# Patient Record
Sex: Male | Born: 1970 | Race: White | Hispanic: No | Marital: Single | Smoking: Never smoker
Health system: Southern US, Community
[De-identification: ages and names within clinical notes are randomized; demographics above are authoritative.]

## PROBLEM LIST (undated history)

## (undated) DIAGNOSIS — I10 Essential (primary) hypertension: Secondary | ICD-10-CM

## (undated) HISTORY — DX: Essential (primary) hypertension: I10

---

## 2005-06-03 ENCOUNTER — Emergency Department (HOSPITAL_COMMUNITY): Admission: EM | Admit: 2005-06-03 | Discharge: 2005-06-03 | Payer: Self-pay | Admitting: Family Medicine

## 2017-06-07 ENCOUNTER — Emergency Department: Payer: Self-pay

## 2017-06-07 ENCOUNTER — Encounter: Payer: Self-pay | Admitting: Emergency Medicine

## 2017-06-07 ENCOUNTER — Emergency Department
Admission: EM | Admit: 2017-06-07 | Discharge: 2017-06-07 | Disposition: A | Discharge status: Home / Self Care | Payer: Self-pay | Attending: Emergency Medicine | Admitting: Emergency Medicine

## 2017-06-07 ENCOUNTER — Other Ambulatory Visit: Payer: Self-pay

## 2017-06-07 DIAGNOSIS — R079 Chest pain, unspecified: Secondary | ICD-10-CM | POA: Insufficient documentation

## 2017-06-07 DIAGNOSIS — K2970 Gastritis, unspecified, without bleeding: Secondary | ICD-10-CM | POA: Insufficient documentation

## 2017-06-07 LAB — BASIC METABOLIC PANEL
Anion gap: 13 (ref 5–15)
BUN: 9 mg/dL (ref 6–20)
CHLORIDE: 104 mmol/L (ref 101–111)
CO2: 22 mmol/L (ref 22–32)
CREATININE: 1.32 mg/dL — AB (ref 0.61–1.24)
Calcium: 10 mg/dL (ref 8.9–10.3)
GFR calc Af Amer: >60 mL/min (ref >60)
GFR calc non Af Amer: >60 mL/min (ref >60)
GLUCOSE: 96 mg/dL (ref 65–99)
POTASSIUM: 3.7 mmol/L (ref 3.5–5.1)
SODIUM: 139 mmol/L (ref 135–145)

## 2017-06-07 LAB — CBC
HEMATOCRIT: 42.8 % (ref 40.0–52.0)
Hemoglobin: 14.9 g/dL (ref 13.0–18.0)
MCH: 31.9 pg (ref 26.0–34.0)
MCHC: 34.7 g/dL (ref 32.0–36.0)
MCV: 92 fL (ref 80.0–100.0)
PLATELETS: 323 10*3/uL (ref 150–440)
RBC: 4.66 MIL/uL (ref 4.40–5.90)
RDW: 12.7 % (ref 11.5–14.5)
WBC: 9.9 10*3/uL (ref 3.8–10.6)

## 2017-06-07 LAB — TROPONIN I: Troponin I: <0.03 ng/mL (ref <0.03)

## 2017-06-07 MED ORDER — FAMOTIDINE 40 MG PO TABS: 40.0000 mg | ORAL_TABLET | Freq: Every evening | ORAL | 1 refills | Status: DC

## 2017-06-07 MED ORDER — GI COCKTAIL ~~LOC~~
ORAL | Status: AC
  Administered 2017-06-07: 30 mL via ORAL
  Filled 2017-06-07: qty 30

## 2017-06-07 MED ORDER — SUCRALFATE 1 G PO TABS: 1.0000 g | ORAL_TABLET | Freq: Four times a day (QID) | ORAL | 0 refills | Status: DC

## 2017-06-07 MED ORDER — GI COCKTAIL ~~LOC~~
30.0000 mL | Freq: Once | ORAL | Status: AC
  Administered 2017-06-07: 30 mL via ORAL

## 2017-06-07 NOTE — Discharge Instructions (Signed)
Please seek medical attention for any high fevers, chest pain, shortness of breath, change in behavior, persistent vomiting, bloody stool or any other new or concerning symptoms.  

## 2017-06-07 NOTE — ED Notes (Signed)
On assessment pt with c/o of upper abd/lower chest pain. Some burning of the chest and difficulty swallowing x 3-4 days. States he has been sick for about a month and was taking mucinex and alka seltzer cold medicine

## 2017-06-07 NOTE — ED Provider Notes (Signed)
[facility] Emergency Department Provider Note   ____________________________________________   I have reviewed the triage vital signs and the nursing notes.   HISTORY  Chief Complaint Chest Pain   History limited by: Not Limited   HPI Jaime Moore is a 47 y.o. male who presents to the emergency department today because of concerns for chest pain.  Patient states this is been going on for the past 3 weeks.  He describes it as tightness.  It is located in the center chest.  He states it is worse at night when he lies down and then goes away throughout the day.  He has not had any associated shortness of breath.  No nausea or vomiting.  He denies similar symptoms in the past.   History reviewed. No pertinent past medical history.  There are no active problems to display for this patient.   History reviewed. No pertinent surgical history.  Prior to Admission medications   Not on File    Allergies Patient has no known allergies.  No family history on file.  Social History Social History   Tobacco Use  . Smoking status: Never Smoker  . Smokeless tobacco: Never Used  Substance Use Topics  . Alcohol use: No    Frequency: Never  . Drug use: No    Review of Systems Constitutional: No fever/chills Eyes: No visual changes. ENT: Difficulty with swallowing.  Cardiovascular: Positive for chest pain. Respiratory: Denies shortness of breath. Gastrointestinal: No abdominal pain.  No nausea, no vomiting.  No diarrhea.   Genitourinary: Negative for dysuria. Musculoskeletal: Negative for back pain. Skin: Negative for rash. Neurological: Negative for headaches, focal weakness or numbness.  ____________________________________________   PHYSICAL EXAM:  VITAL SIGNS: ED Triage Vitals [06/07/17 1646]  Enc Vitals Group     BP      Pulse Rate 82     Resp 18     Temp 98.1 F (36.7 C)     Temp Source Oral     SpO2 96 %     Weight      Height    Head Circumference      Peak Flow      Pain Score 8   Constitutional: Alert and oriented. Well appearing and in no distress. Eyes: Conjunctivae are normal.  ENT   Head: Normocephalic and atraumatic.   Nose: No congestion/rhinnorhea.   Mouth/Throat: Mucous membranes are moist.   Neck: No stridor. Hematological/Lymphatic/Immunilogical: No cervical lymphadenopathy. Cardiovascular: Normal rate, regular rhythm.  No murmurs, rubs, or gallops.  Respiratory: Normal respiratory effort without tachypnea nor retractions. Breath sounds are clear and equal bilaterally. No wheezes/rales/rhonchi. Gastrointestinal: Soft and non tender. No rebound. No guarding.  Genitourinary: Deferred Musculoskeletal: Normal range of motion in all extremities. No lower extremity edema. Neurologic:  Normal speech and language. No gross focal neurologic deficits are appreciated.  Skin:  Skin is warm, dry and intact. No rash noted. Psychiatric: Mood and affect are normal. Speech and behavior are normal. Patient exhibits appropriate insight and judgment.  ____________________________________________    LABS (pertinent positives/negatives)  Trop <0.03 CBC wnl BMP cr 1.32  ____________________________________________   EKG  I, Phineas SemenGraydon Daion Ginsberg, attending physician, personally viewed and interpreted this EKG  EKG Time: 1637 Rate: 94 Rhythm: normal sinus rhythm Axis: normal Intervals: qtc 437 QRS: narrow ST changes: no st elevation Impression: possible left atrial enlargement, borderline EKG   ____________________________________________    RADIOLOGY  CXR No acute disease  ____________________________________________   PROCEDURES  Procedures  ____________________________________________   INITIAL IMPRESSION / ASSESSMENT AND PLAN / ED COURSE  Pertinent labs & imaging results that were available during my care of the patient were reviewed by me and considered in my medical decision  making (see chart for details).  Patient presented to the emergency department today because of concerns for difficulty with swallowing.  Differential would be broad including cardiac etiology, pneumonia, pneumothorax, esophagitis, anxiety, musculoskeletal inflammation amongst other etiologies.  Patient's chest x-ray and blood work without concerning findings.  EKG without any concerning findings.  At this time I do wonder if it could be related more to esophagitis and GERD.  It is worse when he lies down at bed at night. Discussed findings and plan with patient. Will discharge with antacid and sucralfate prescription.  ____________________________________________   FINAL CLINICAL IMPRESSION(S) / ED DIAGNOSES  Final diagnoses:  Nonspecific chest pain  Gastritis without bleeding, unspecified chronicity, unspecified gastritis type     Note: This dictation was prepared with Dragon dictation. Any transcriptional errors that result from this process are unintentional     Phineas Semen, MD 06/08/17 1555

## 2017-06-07 NOTE — ED Triage Notes (Signed)
Pt reports chest tightness and difficulty swallowing x 3 weeks, states he is tired of dealing with it and family encouraged him come to be evaluated.  Pt has hx of brother in 7550s die from MI.

## 2018-09-17 ENCOUNTER — Emergency Department: Payer: Self-pay

## 2018-09-17 ENCOUNTER — Emergency Department
Admission: EM | Admit: 2018-09-17 | Discharge: 2018-09-17 | Disposition: A | Discharge status: Home / Self Care | Payer: Self-pay | Attending: Emergency Medicine | Admitting: Emergency Medicine

## 2018-09-17 ENCOUNTER — Encounter: Payer: Self-pay | Admitting: Emergency Medicine

## 2018-09-17 ENCOUNTER — Other Ambulatory Visit: Payer: Self-pay

## 2018-09-17 DIAGNOSIS — M436 Torticollis: Secondary | ICD-10-CM | POA: Insufficient documentation

## 2018-09-17 MED ORDER — CYCLOBENZAPRINE HCL 10 MG PO TABS: 10.0000 mg | ORAL_TABLET | Freq: Three times a day (TID) | ORAL | 0 refills | Status: DC | PRN

## 2018-09-17 MED ORDER — MELOXICAM 15 MG PO TABS: 15.0000 mg | ORAL_TABLET | Freq: Every day | ORAL | 0 refills | Status: DC

## 2018-09-17 NOTE — ED Notes (Signed)
See triage note  Presents with pain to left side of neck and into left shoulder  States pain started about 2 weeks ago   States he woke up with pain  Min relief  With ice/heat and IBU

## 2018-09-17 NOTE — Discharge Instructions (Signed)
Follow up with orthopedics if you are not improving over the week.  Return to the ER for symptoms that change or worsen if unable to schedule an appointment with the specialist or your primary care provider.

## 2018-09-17 NOTE — ED Triage Notes (Signed)
Presents with pain from neck into left shoulder   States pain started 2 weeks ago w/o injury

## 2018-09-17 NOTE — ED Provider Notes (Signed)
[facility] Emergency Department Provider Note ____________________________________________  Time seen: Approximately 10:20 AM  I have reviewed the triage vital signs and the nursing notes.   HISTORY  Chief Complaint Neck Pain and Shoulder Pain    HPI Jaime Moore is a 48 y.o. male who presents to the emergency department for evaluation and treatment of left side neck pain x 2 weeks. Pain started upon awakening. No relief with ibuprofen, tylenol, and heating pad. No specific injury, but he does a lot of heavy lifting. No history of neck pain, but has had bulging disc in his back.   History reviewed. No pertinent past medical history.  There are no active problems to display for this patient.   History reviewed. No pertinent surgical history.  Prior to Admission medications   Medication Sig Start Date End Date Taking? Authorizing Provider  cyclobenzaprine (FLEXERIL) 10 MG tablet Take 1 tablet (10 mg total) by mouth 3 (three) times daily as needed. 09/17/18   Nehemiah Mcfarren, Rulon Eisenmengerari B, FNP  meloxicam (MOBIC) 15 MG tablet Take 1 tablet (15 mg total) by mouth daily. 09/17/18   Chinita Pesterriplett, Bardia Wangerin B, FNP    Allergies Patient has no known allergies.  No family history on file.  Social History Social History   Tobacco Use  . Smoking status: Never Smoker  . Smokeless tobacco: Never Used  Substance Use Topics  . Alcohol use: No    Frequency: Never  . Drug use: No    Review of Systems Constitutional: Negative for fever. Cardiovascular: Negative for chest pain. Respiratory: Negative for shortness of breath. Musculoskeletal: Positive for neck pain. Skin: No open wounds or lesions.  Neurological: Negative for decrease in sensation  ____________________________________________   PHYSICAL EXAM:  VITAL SIGNS: ED Triage Vitals  Enc Vitals Group     BP 09/17/18 0927 (!) 160/93     Pulse Rate 09/17/18 0927 81     Resp 09/17/18 0927 18     Temp 09/17/18 0927 97.9  F (36.6 C)     Temp Source 09/17/18 0927 Oral     SpO2 09/17/18 0927 99 %     Weight 09/17/18 0927 205 lb (93 kg)     Height 09/17/18 0927 5\' 7"  (1.702 m)     Head Circumference --      Peak Flow --      Pain Score 09/17/18 0924 8     Pain Loc --      Pain Edu? --      Excl. in GC? --     Constitutional: Alert and oriented. Well appearing and in no acute distress. Eyes: Conjunctivae are clear without discharge or drainage Head: Atraumatic Neck: Supple. No focal midline tenderness. Respiratory: No cough. Respirations are even and unlabored. Musculoskeletal: Paracervical tenderness on the left and trapezius. Pain increases with rotation of head to the right. Neurologic: No radiculopathy. Motor and sensory function is intact.  Skin: No open wounds or lesions over area of concern.  Psychiatric: Affect and behavior are appropriate.  ____________________________________________   LABS (all labs ordered are listed, but only abnormal results are displayed)  Labs Reviewed - No data to display ____________________________________________  RADIOLOGY  No acute abnormality of the cervical spine. Degenerative changes and mild spurring noted. ____________________________________________   PROCEDURES  Procedures  ____________________________________________   INITIAL IMPRESSION / ASSESSMENT AND PLAN / ED COURSE  Jaime NgoJerry Christenson is a 48 y.o. who presents to the emergency department for treatment of neck pain. C-spine image shows no acute abnormality.  He will be treated with meloxicam and flexeril  Patient instructed to follow-up with orthopedics if not improving over the week.  He was also instructed to return to the emergency department for symptoms that change or worsen if unable schedule an appointment with orthopedics or primary care.  Medications - No data to display  Pertinent labs & imaging results that were available during my care of the patient were reviewed by me and  considered in my medical decision making (see chart for details).  _________________________________________   FINAL CLINICAL IMPRESSION(S) / ED DIAGNOSES  Final diagnoses:  Torticollis    ED Discharge Orders         Ordered    cyclobenzaprine (FLEXERIL) 10 MG tablet  3 times daily PRN     09/17/18 1018    meloxicam (MOBIC) 15 MG tablet  Daily     09/17/18 1018           If controlled substance prescribed during this visit, 12 month history viewed on the [facility] prior to issuing an initial prescription for Schedule II or III opiod.   Chinita Pester, FNP 09/17/18 1025    Don Perking, [place], MD 09/17/18 1034

## 2018-10-05 ENCOUNTER — Encounter: Payer: Self-pay | Admitting: Emergency Medicine

## 2018-10-05 ENCOUNTER — Other Ambulatory Visit: Payer: Self-pay

## 2018-10-05 DIAGNOSIS — E785 Hyperlipidemia, unspecified: Secondary | ICD-10-CM | POA: Insufficient documentation

## 2018-10-05 DIAGNOSIS — Z1159 Encounter for screening for other viral diseases: Secondary | ICD-10-CM | POA: Insufficient documentation

## 2018-10-05 DIAGNOSIS — X58XXXA Exposure to other specified factors, initial encounter: Secondary | ICD-10-CM | POA: Insufficient documentation

## 2018-10-05 DIAGNOSIS — I351 Nonrheumatic aortic (valve) insufficiency: Secondary | ICD-10-CM | POA: Insufficient documentation

## 2018-10-05 DIAGNOSIS — M50322 Other cervical disc degeneration at C5-C6 level: Secondary | ICD-10-CM | POA: Insufficient documentation

## 2018-10-05 DIAGNOSIS — I10 Essential (primary) hypertension: Secondary | ICD-10-CM | POA: Insufficient documentation

## 2018-10-05 DIAGNOSIS — R9431 Abnormal electrocardiogram [ECG] [EKG]: Secondary | ICD-10-CM | POA: Insufficient documentation

## 2018-10-05 DIAGNOSIS — R9389 Abnormal findings on diagnostic imaging of other specified body structures: Secondary | ICD-10-CM | POA: Insufficient documentation

## 2018-10-05 DIAGNOSIS — S0232XA Fracture of orbital floor, left side, initial encounter for closed fracture: Secondary | ICD-10-CM | POA: Insufficient documentation

## 2018-10-05 DIAGNOSIS — R42 Dizziness and giddiness: Principal | ICD-10-CM | POA: Insufficient documentation

## 2018-10-05 DIAGNOSIS — M2578 Osteophyte, vertebrae: Secondary | ICD-10-CM | POA: Insufficient documentation

## 2018-10-05 DIAGNOSIS — I6502 Occlusion and stenosis of left vertebral artery: Secondary | ICD-10-CM | POA: Insufficient documentation

## 2018-10-05 DIAGNOSIS — M4802 Spinal stenosis, cervical region: Secondary | ICD-10-CM | POA: Insufficient documentation

## 2018-10-05 DIAGNOSIS — M50323 Other cervical disc degeneration at C6-C7 level: Secondary | ICD-10-CM | POA: Insufficient documentation

## 2018-10-05 DIAGNOSIS — Z79899 Other long term (current) drug therapy: Secondary | ICD-10-CM | POA: Insufficient documentation

## 2018-10-05 LAB — CBC WITH DIFFERENTIAL/PLATELET
Abs Immature Granulocytes: 0.03 10*3/uL (ref 0.00–0.07)
Basophils Absolute: 0.1 10*3/uL (ref 0.0–0.1)
Basophils Relative: 0 %
Eosinophils Absolute: 0.1 10*3/uL (ref 0.0–0.5)
Eosinophils Relative: 0 %
HCT: 43.8 % (ref 39.0–52.0)
Hemoglobin: 15.2 g/dL (ref 13.0–17.0)
Immature Granulocytes: 0 %
Lymphocytes Relative: 26 %
Lymphs Abs: 3 10*3/uL (ref 0.7–4.0)
MCH: 31.5 pg (ref 26.0–34.0)
MCHC: 34.7 g/dL (ref 30.0–36.0)
MCV: 90.7 fL (ref 80.0–100.0)
Monocytes Absolute: 0.9 10*3/uL (ref 0.1–1.0)
Monocytes Relative: 8 %
Neutro Abs: 7.6 10*3/uL (ref 1.7–7.7)
Neutrophils Relative %: 66 %
Platelets: 298 10*3/uL (ref 150–400)
RBC: 4.83 MIL/uL (ref 4.22–5.81)
RDW: 12 % (ref 11.5–15.5)
WBC: 11.7 10*3/uL — ABNORMAL HIGH (ref 4.0–10.5)
nRBC: 0 % (ref 0.0–0.2)

## 2018-10-05 LAB — COMPREHENSIVE METABOLIC PANEL
ALT: 30 U/L (ref 0–44)
AST: 24 U/L (ref 15–41)
Albumin: 4.7 g/dL (ref 3.5–5.0)
Alkaline Phosphatase: 60 U/L (ref 38–126)
Anion gap: 13 (ref 5–15)
BUN: 16 mg/dL (ref 6–20)
CO2: 22 mmol/L (ref 22–32)
Calcium: 9.4 mg/dL (ref 8.9–10.3)
Chloride: 102 mmol/L (ref 98–111)
Creatinine, Ser: 1.12 mg/dL (ref 0.61–1.24)
GFR calc Af Amer: >60 mL/min (ref >60)
GFR calc non Af Amer: >60 mL/min (ref >60)
Glucose, Bld: 121 mg/dL — ABNORMAL HIGH (ref 70–99)
Potassium: 3.9 mmol/L (ref 3.5–5.1)
Sodium: 137 mmol/L (ref 135–145)
Total Bilirubin: 1.3 mg/dL — ABNORMAL HIGH (ref 0.3–1.2)
Total Protein: 8 g/dL (ref 6.5–8.1)

## 2018-10-05 LAB — URINALYSIS, COMPLETE (UACMP) WITH MICROSCOPIC
Bacteria, UA: NONE SEEN
Bilirubin Urine: NEGATIVE
Glucose, UA: NEGATIVE mg/dL
Hgb urine dipstick: NEGATIVE
Ketones, ur: 5 mg/dL — AB
Leukocytes,Ua: NEGATIVE
Nitrite: NEGATIVE
Protein, ur: 30 mg/dL — AB
Specific Gravity, Urine: 1.028 (ref 1.005–1.030)
pH: 5 (ref 5.0–8.0)

## 2018-10-05 LAB — TROPONIN I: Troponin I: <0.03 ng/mL (ref <0.03)

## 2018-10-05 NOTE — ED Triage Notes (Signed)
Patient ambulatory to triage with steady gait, without difficulty or distress noted, mask in place; pt reports dizziness accomp by nausea x wk

## 2018-10-06 ENCOUNTER — Emergency Department: Payer: Self-pay

## 2018-10-06 ENCOUNTER — Other Ambulatory Visit: Payer: Self-pay

## 2018-10-06 ENCOUNTER — Encounter: Payer: Self-pay | Admitting: Radiology

## 2018-10-06 ENCOUNTER — Observation Stay
Admit: 2018-10-06 | Discharge: 2018-10-06 | Disposition: A | Discharge status: Home / Self Care | Payer: Self-pay | Attending: Internal Medicine | Admitting: Internal Medicine

## 2018-10-06 ENCOUNTER — Observation Stay
Admission: EM | Admit: 2018-10-06 | Discharge: 2018-10-07 | Disposition: A | Discharge status: Home / Self Care | Payer: Self-pay | Attending: Internal Medicine | Admitting: Internal Medicine

## 2018-10-06 DIAGNOSIS — R9389 Abnormal findings on diagnostic imaging of other specified body structures: Secondary | ICD-10-CM

## 2018-10-06 DIAGNOSIS — R42 Dizziness and giddiness: Secondary | ICD-10-CM

## 2018-10-06 LAB — CBC
HCT: 44.5 % (ref 39.0–52.0)
Hemoglobin: 15.2 g/dL (ref 13.0–17.0)
MCH: 31.6 pg (ref 26.0–34.0)
MCHC: 34.2 g/dL (ref 30.0–36.0)
MCV: 92.5 fL (ref 80.0–100.0)
Platelets: 288 10*3/uL (ref 150–400)
RBC: 4.81 MIL/uL (ref 4.22–5.81)
RDW: 12 % (ref 11.5–15.5)
WBC: 10.5 10*3/uL (ref 4.0–10.5)
nRBC: 0 % (ref 0.0–0.2)

## 2018-10-06 LAB — CREATININE, SERUM
Creatinine, Ser: 0.95 mg/dL (ref 0.61–1.24)
GFR calc Af Amer: >60 mL/min (ref >60)
GFR calc non Af Amer: >60 mL/min (ref >60)

## 2018-10-06 LAB — SARS CORONAVIRUS 2 BY RT PCR (HOSPITAL ORDER, PERFORMED IN ~~LOC~~ HOSPITAL LAB): SARS Coronavirus 2: NEGATIVE

## 2018-10-06 MED ORDER — SODIUM CHLORIDE 0.9 % IV BOLUS
1000.0000 mL | Freq: Once | INTRAVENOUS | Status: AC
  Administered 2018-10-06: 1000 mL via INTRAVENOUS

## 2018-10-06 MED ORDER — ENOXAPARIN SODIUM 40 MG/0.4ML ~~LOC~~ SOLN
40.0000 mg | SUBCUTANEOUS | Status: DC
  Administered 2018-10-06 – 2018-10-07 (×2): 40 mg via SUBCUTANEOUS
  Filled 2018-10-06 (×2): qty 0.4

## 2018-10-06 MED ORDER — ACETAMINOPHEN 650 MG RE SUPP: 650.0000 mg | RECTAL | Status: DC | PRN

## 2018-10-06 MED ORDER — MECLIZINE HCL 25 MG PO TABS
25.0000 mg | ORAL_TABLET | Freq: Once | ORAL | Status: AC
  Administered 2018-10-06: 25 mg via ORAL
  Filled 2018-10-06: qty 1

## 2018-10-06 MED ORDER — MECLIZINE HCL 12.5 MG PO TABS
12.5000 mg | ORAL_TABLET | Freq: Two times a day (BID) | ORAL | Status: DC | PRN
  Filled 2018-10-06: qty 1

## 2018-10-06 MED ORDER — IOHEXOL 350 MG/ML SOLN
75.0000 mL | Freq: Once | INTRAVENOUS | Status: AC | PRN
  Administered 2018-10-06: 75 mL via INTRAVENOUS

## 2018-10-06 MED ORDER — ONDANSETRON HCL 4 MG/2ML IJ SOLN
4.0000 mg | Freq: Once | INTRAMUSCULAR | Status: AC
  Administered 2018-10-06: 4 mg via INTRAVENOUS
  Filled 2018-10-06: qty 2

## 2018-10-06 MED ORDER — ACETAMINOPHEN 160 MG/5ML PO SOLN
650.0000 mg | ORAL | Status: DC | PRN
  Filled 2018-10-06: qty 20.3

## 2018-10-06 MED ORDER — STROKE: EARLY STAGES OF RECOVERY BOOK
Freq: Once | Status: AC
  Administered 2018-10-06: 12:00:00

## 2018-10-06 MED ORDER — ACETAMINOPHEN 325 MG PO TABS: 650.0000 mg | ORAL_TABLET | ORAL | Status: DC | PRN

## 2018-10-06 NOTE — ED Notes (Signed)
ED Provider at bedside. 

## 2018-10-06 NOTE — Evaluation (Signed)
Physical Therapy Evaluation Patient Details Name: Jaime Moore MRN: [id number] DOB: October 05, 1970 Today's Date: 10/06/2018   History of Present Illness  Dominic Mahaney is a 48yo male who comes to [facility] on 6/9 c c/o of dizziness, mostly associated with positional changes in and to/from bed. 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 Pt reports symptoms typically  resolve after 30 seconds, but he deciided to come in after one of the more recent exacerbations lasted longer than 60 seconds. Pt confirms 'room spinning' sensation when symptoms happen, as well as "queasiness."   Clinical Impression  Pt admitted with above diagnosis. Pt currently with functional limitations due to the deficits listed below (see "PT Problem List"). Upon entry, pt in bed, awake and agreeable to participate. The pt is alert and oriented x3, pleasant, conversational, and generally a good historian. History provided as detailed above, pt presentation similar to typical presentation of BPPV. Author performed Dix-Hallpike test, results positive with Rt head turn- reproduced symptoms, visually obvious nystagmus that is torsional in nature and resolves in 25-seconds. Dix Hallpike negative with Left head turn, although patient endorses some minimal dizziness, not disruption to occular gaze can is demonstrated. Pt is treated with Epley maneuver for Right sided BPPV without issue. Pt then performs transfer and AMB in hall at his baseline level but minimal and intermittent dizziness while AMB associated with some vertical head movements required for safe navigation of the environment. Pt reports not having spoke to neurology yet since admitted to floor, but given results of MRI, pt may benefit from follow-up with vestibular-specialist PT to perform a more detailed assessment of the patient's vestibular system and offer treatments as needed. Pt will benefit from skilled PT intervention to increase independence and  safety with basic mobility in preparation for discharge to the venue listed below.     Follow Up Recommendations Outpatient PT(More in-depth vestibular assessent with a specialized PT )    Equipment Recommendations  None recommended by PT    Recommendations for Other Services       Precautions / Restrictions Precautions Precautions: None Restrictions Weight Bearing Restrictions: No      Mobility  Bed Mobility Overal bed mobility: Independent                Transfers Overall transfer level: Independent                  Ambulation/Gait Ambulation/Gait assistance: Independent Gait Distance (Feet): 250 Feet Assistive device: None       General Gait Details: No gait instability,pt reports to be near baseline. Pt reports somie minimal dizziness with heav movements during AMB trial.   Stairs            Wheelchair Mobility    Modified Rankin (Stroke Patients Only)       Balance Overall balance assessment: Independent;No apparent balance deficits (not formally assessed)                                           Pertinent Vitals/Pain Pain Assessment: No/denies pain    Home Living Family/patient expects to be discharged to:: Private residence Living Arrangements: Other relatives Available Help at Discharge: Family Type of Home: Mobile home   Entrance Stairs-Rails: Can reach both Entrance Stairs-Number of Steps: 5 Home Layout: One level Home Equipment: None      Prior  Function                 Hand Dominance        Extremity/Trunk Assessment   Upper Extremity Assessment Upper Extremity Assessment: Overall WFL for tasks assessed    Lower Extremity Assessment Lower Extremity Assessment: Overall WFL for tasks assessed    Cervical / Trunk Assessment Cervical / Trunk Assessment: Normal  Communication      Cognition Arousal/Alertness: Awake/alert Behavior During Therapy: WFL for tasks  assessed/performed Overall Cognitive Status: Within Functional Limits for tasks assessed                                        General Comments      Exercises     Assessment/Plan    PT Assessment Patient needs continued PT services  PT Problem List Decreased knowledge of precautions;Decreased activity tolerance       PT Treatment Interventions Therapeutic exercise;Neuromuscular re-education;Therapeutic activities    PT Goals (Current goals can be found in the Care Plan section)  Acute Rehab PT Goals Patient Stated Goal: decrease dizziness PT Goal Formulation: With patient Time For Goal Achievement: 10/20/18 Potential to Achieve Goals: Good    Frequency Min 2X/week   Barriers to discharge        Co-evaluation               AM-PAC PT "6 Clicks" Mobility  Outcome Measure Help needed turning from your back to your side while in a flat bed without using bedrails?: None Help needed moving from lying on your back to sitting on the side of a flat bed without using bedrails?: None Help needed moving to and from a bed to a chair (including a wheelchair)?: None Help needed standing up from a chair using your arms (e.g., wheelchair or bedside chair)?: None Help needed to walk in hospital room?: None Help needed climbing 3-5 steps with a railing? : None 6 Click Score: 24    End of Session   Activity Tolerance: Patient tolerated treatment well;No increased pain Patient left: in chair;with call bell/phone within reach Nurse Communication: Mobility status PT Visit Diagnosis: Other abnormalities of gait and mobility (R26.89);Other symptoms and signs involving the nervous system (R29.898);Dizziness and giddiness (R42)    Time: [id number] PT Time Calculation (min) (ACUTE ONLY): 27 min   Charges:   PT Evaluation $PT Eval Low Complexity: 1 Low PT Treatments $Canalith Rep Proc: 8-22 mins       1:53 PM, 10/06/18 Rosamaria LintsAllan C Aujanae Mccullum, PT, DPT Physical  Therapist - [facility]  [phone] (ASCOM)   Ytzel Gubler C 10/06/2018, 1:44 PM

## 2018-10-06 NOTE — ED Provider Notes (Signed)
IMPRESSION: 1. Normal CTA circle-of-Willis. No acute or focal vascular lesion to explain the patient's dizziness, nausea, or abnormal MRI findings. 2. Minimal atherosclerotic change at the right carotid bifurcation without significant stenosis. 3. Mild narrowing of the left vertebral artery origin without a significant stenosis relative to the more distal vessel. 4. Cervical disc disease is most evident at C6-7 where a prominent disc osteophyte complex results in central canal narrowing.  CTA findings as dictated above.  I will update the hospitalist for admission.   Earleen Newport, MD 10/06/18 [phone]

## 2018-10-06 NOTE — ED Notes (Signed)
Gwenette Greet, RN attempted to call report without success.

## 2018-10-06 NOTE — ED Notes (Signed)
ED TO INPATIENT HANDOFF REPORT  ED Nurse Name and Phone #: Gwenette Greet [place] Name/Age/Gender Kingsley Callander 48 y.o. male Room/Bed: ED07A/ED07A  Code Status   Code Status: Full Code  Home/SNF/Other Home Patient oriented to: self, place, time and situation Is this baseline? Yes   Triage Complete: Triage complete  Chief Complaint Dizzy  Nausea  Triage Note Patient ambulatory to triage with steady gait, without difficulty or distress noted, mask in place; pt reports dizziness accomp by nausea x wk   Allergies No Known Allergies  Level of Care/Admitting Diagnosis ED Disposition    ED Disposition Condition [place]: [place] [100120]  Level of Care: Med-Surg [16]  Covid Evaluation: Screening Protocol (No Symptoms)  Diagnosis: Dizziness [213086]  Admitting Physician: Epifanio Lesches [[id number]]  Attending Physician: Epifanio Lesches [phone]  PT Class (Do Not Modify): Observation [104]  PT Acc Code (Do Not Modify): Observation [10022]       B Medical/Surgery History History reviewed. No pertinent past medical history. History reviewed. No pertinent surgical history.   A IV Location/Drains/Wounds Patient Lines/Drains/Airways Status   Active Line/Drains/Airways    Name:   Placement date:   Placement time:   Site:   Days:   Peripheral IV 10/06/18 Right Antecubital   10/06/18    0244    Antecubital   less than 1          Intake/Output Last 24 hours  Intake/Output Summary (Last 24 hours) at 10/06/2018 0836 Last data filed at 10/06/2018 0407 Gross per 24 hour  Intake 1000 ml  Output -  Net 1000 ml    Labs/Imaging Results for orders placed or performed during the hospital encounter of 10/06/18 (from the past 48 hour(s))  CBC with Differential     Status: Abnormal   Collection Time: 10/05/18 10:38 PM  Result Value Ref Range   WBC 11.7 (H) 4.0 - 10.5 K/uL   RBC 4.83 4.22 - 5.81 MIL/uL   Hemoglobin 15.2 13.0 - 17.0  g/dL   HCT 43.8 39.0 - 52.0 %   MCV 90.7 80.0 - 100.0 fL   MCH 31.5 26.0 - 34.0 pg   MCHC 34.7 30.0 - 36.0 g/dL   RDW 12.0 11.5 - 15.5 %   Platelets 298 150 - 400 K/uL   nRBC 0.0 0.0 - 0.2 %   Neutrophils Relative % 66 %   Neutro Abs 7.6 1.7 - 7.7 K/uL   Lymphocytes Relative 26 %   Lymphs Abs 3.0 0.7 - 4.0 K/uL   Monocytes Relative 8 %   Monocytes Absolute 0.9 0.1 - 1.0 K/uL   Eosinophils Relative 0 %   Eosinophils Absolute 0.1 0.0 - 0.5 K/uL   Basophils Relative 0 %   Basophils Absolute 0.1 0.0 - 0.1 K/uL   Immature Granulocytes 0 %   Abs Immature Granulocytes 0.03 0.00 - 0.07 K/uL    Comment: Performed at [facility], [address] [place], [place] [address]  Comprehensive metabolic panel     Status: Abnormal   Collection Time: 10/05/18 10:38 PM  Result Value Ref Range   Sodium 137 135 - 145 mmol/L   Potassium 3.9 3.5 - 5.1 mmol/L    Comment: HEMOLYSIS AT THIS LEVEL MAY AFFECT RESULT   Chloride 102 98 - 111 mmol/L   CO2 22 22 - 32 mmol/L   Glucose, Bld 121 (H) 70 - 99 mg/dL   BUN 16 6 - 20 mg/dL   Creatinine,  Ser 1.12 0.61 - 1.24 mg/dL   Calcium 9.4 8.9 - 16.1 mg/dL   Total Protein 8.0 6.5 - 8.1 g/dL   Albumin 4.7 3.5 - 5.0 g/dL   AST 24 15 - 41 U/L   ALT 30 0 - 44 U/L   Alkaline Phosphatase 60 38 - 126 U/L   Total Bilirubin 1.3 (H) 0.3 - 1.2 mg/dL   GFR calc non Af Amer >60 >60 mL/min   GFR calc Af Amer >60 >60 mL/min   Anion gap 13 5 - 15    Comment: Performed at [facility], [address] [place], [place] [address]  Troponin I - ONCE - STAT     Status: None   Collection Time: 10/05/18 10:38 PM  Result Value Ref Range   Troponin I <0.03 <0.03 ng/mL    Comment: Performed at [facility], [address] [place], [place] [address]  Urinalysis, Complete w Microscopic     Status: Abnormal   Collection Time: 10/05/18 10:38 PM  Result Value Ref Range   Color, Urine YELLOW (A) YELLOW   APPearance HAZY (A) CLEAR   Specific Gravity,  Urine 1.028 1.005 - 1.030   pH 5.0 5.0 - 8.0   Glucose, UA NEGATIVE NEGATIVE mg/dL   Hgb urine dipstick NEGATIVE NEGATIVE   Bilirubin Urine NEGATIVE NEGATIVE   Ketones, ur 5 (A) NEGATIVE mg/dL   Protein, ur 30 (A) NEGATIVE mg/dL   Nitrite NEGATIVE NEGATIVE   Leukocytes,Ua NEGATIVE NEGATIVE   RBC / HPF 6-10 0 - 5 RBC/hpf   WBC, UA 0-5 0 - 5 WBC/hpf   Bacteria, UA NONE SEEN NONE SEEN   Squamous Epithelial / LPF 0-5 0 - 5   Mucus PRESENT     Comment: Performed at [facility], [address] [place], [place] [address]  SARS Coronavirus 2 (CEPHEID - Performed in [facility] hospital lab), Hosp Order     Status: None   Collection Time: 10/06/18  6:56 AM  Result Value Ref Range   SARS Coronavirus 2 NEGATIVE NEGATIVE    Comment: (NOTE) If result is NEGATIVE SARS-CoV-2 target nucleic acids are NOT DETECTED. The SARS-CoV-2 RNA is generally detectable in upper and lower  respiratory specimens during the acute phase of infection. The lowest  concentration of SARS-CoV-2 viral copies this assay can detect is 250  copies / mL. A negative result does not preclude SARS-CoV-2 infection  and should not be used as the sole basis for treatment or other  patient management decisions.  A negative result may occur with  improper specimen collection / handling, submission of specimen other  than nasopharyngeal swab, presence of viral mutation(s) within the  areas targeted by this assay, and inadequate number of viral copies  (<250 copies / mL). A negative result must be combined with clinical  observations, patient history, and epidemiological information. If result is POSITIVE SARS-CoV-2 target nucleic acids are DETECTED. The SARS-CoV-2 RNA is generally detectable in upper and lower  respiratory specimens dur ing the acute phase of infection.  Positive  results are indicative of active infection with SARS-CoV-2.  Clinical  correlation with patient history and other diagnostic information  is  necessary to determine patient infection status.  Positive results do  not rule out bacterial infection or co-infection with other viruses. If result is PRESUMPTIVE POSTIVE SARS-CoV-2 nucleic acids MAY BE PRESENT.   A presumptive positive result was obtained on the submitted specimen  and confirmed on repeat testing.  While 2019  novel coronavirus  (SARS-CoV-2) nucleic acids may be present in the submitted sample  additional confirmatory testing may be necessary for epidemiological  and / or clinical management purposes  to differentiate between  SARS-CoV-2 and other Sarbecovirus currently known to infect humans.  If clinically indicated additional testing with an alternate test  methodology [phone] is advised. The SARS-CoV-2 RNA is generally  detectable in upper and lower respiratory sp ecimens during the acute  phase of infection. The expected result is Negative. Fact Sheet for Patients:  BoilerBrush.com.cyhttps://www.fda.gov/media/136312/download Fact Sheet for Healthcare Providers: https://pope.com/https://www.fda.gov/media/136313/download This test is not yet approved or cleared by the [place] FDA and has been authorized for detection and/or diagnosis of SARS-CoV-2 by FDA under an Emergency Use Authorization (EUA).  This EUA will remain in effect (meaning this test can be used) for the duration of the COVID-19 declaration under Section 564(b)(1) of the Act, 21 U.S.C. section 360bbb-3(b)(1), unless the authorization is terminated or revoked sooner. Performed at [facility], [address] [place], [place] [id number]    Ct Angio Head W Or Wo Contrast  Result Date: 10/06/2018 CLINICAL DATA:  Dizziness and nausea for 1 week. Vertigo, persistent, central. Abnormal MRI with diffuse subcortical and periventricular T2 hyperintensities bilaterally. EXAM: CT ANGIOGRAPHY HEAD AND NECK TECHNIQUE: Multidetector CT imaging of the head and neck was performed using the standard protocol during bolus  administration of intravenous contrast. Multiplanar CT image reconstructions and MIPs were obtained to evaluate the vascular anatomy. Carotid stenosis measurements (when applicable) are obtained utilizing NASCET criteria, using the distal internal carotid diameter as the denominator. CONTRAST:  75mL OMNIPAQUE IOHEXOL 350 MG/ML SOLN COMPARISON:  MRI brain 10/06/2018. CT head without contrast 10/06/2018 FINDINGS: CTA NECK FINDINGS Aortic arch: A 3 vessel arch configuration is present. No significant atherosclerotic changes present at the arch or great vessel origins. There is no aneurysm or focal stenosis. Right carotid system: Right common carotid artery is within normal limits. Minimal atherosclerotic calcifications are present at the right carotid bifurcation. There is no significant stenosis. The cervical right ICA is otherwise normal Left carotid system: The left common carotid artery is within normal limits. Bifurcation is unremarkable. Cervical left ICA is normal. Vertebral arteries: Vertebral arteries originate from the subclavian arteries bilaterally. The right vertebral artery is the dominant vessel. There is a 50% stenosis at the origin of the left vertebral artery. No tandem stenoses are present in either vertebral artery in the neck. Skeleton: Prominent disc osteophyte complex is present C6-7 and C7-T1. There is likely central canal stenosis at the C6-7 level. Vertebral body heights are maintained. There is reversal of the normal cervical lordosis. No focal lytic or blastic lesions are present. Other neck: No focal mucosal or submucosal lesions are present. Salivary glands are within normal limits bilaterally. Upper chest: Lung apices are clear.  Thoracic inlet is normal. Review of the MIP images confirms the above findings CTA HEAD FINDINGS Anterior circulation: The internal carotid arteries are within normal limits from the skull base through the ICA termini bilaterally. A1 and M1 segments are normal.  The anterior communicating artery is patent. MCA bifurcations are intact. The ACA and MCA branch vessels are normal. Posterior circulation: Vertebral arteries are codominant at the skull base. PICA origins are visualized and normal. The vertebrobasilar junction is normal. Both posterior cerebral arteries originate from basilar tip. PCA branch vessels are normal. Venous sinuses: The dural sinuses are patent. Left transverse sinus is dominant. Straight sinus deep cerebral veins are intact. Cortical veins are  unremarkable. Anatomic variants: None Review of the MIP images confirms the above findings IMPRESSION: 1. Normal CTA circle-of-Willis. No acute or focal vascular lesion to explain the patient's dizziness, nausea, or abnormal MRI findings. 2. Minimal atherosclerotic change at the right carotid bifurcation without significant stenosis. 3. Mild narrowing of the left vertebral artery origin without a significant stenosis relative to the more distal vessel. 4. Cervical disc disease is most evident at C6-7 where a prominent disc osteophyte complex results in central canal narrowing. Electronically Signed   By: Marin Robertshristopher  Mattern M.D.   On: 10/06/2018 07:15   Ct Head Wo Contrast  Result Date: 10/06/2018 CLINICAL DATA:  48 year old male with acute dizziness. EXAM: CT HEAD WITHOUT CONTRAST TECHNIQUE: Contiguous axial images were obtained from the base of the skull through the vertex without intravenous contrast. COMPARISON:  None. FINDINGS: Brain: No midline shift, mass effect, or evidence of intracranial mass lesion. No ventriculomegaly. Gray-white matter differentiation is within normal limits throughout the brain. No cortically based acute infarct identified. No acute intracranial hemorrhage identified. Vascular: No suspicious intracranial vascular hyperdensity. Skull: Partially visible small chronic left lamina papyracea fracture. No acute osseous abnormality identified. Sinuses/Orbits: Visualized paranasal  sinuses and mastoids are well pneumatized. Other: No acute orbit or scalp soft tissue finding. IMPRESSION: Negative noncontrast CT appearance of the brain. Electronically Signed   By: Odessa FlemingH  Hall M.D.   On: 10/06/2018 03:10   Ct Angio Neck W And/or Wo Contrast  Result Date: 10/06/2018 CLINICAL DATA:  Dizziness and nausea for 1 week. Vertigo, persistent, central. Abnormal MRI with diffuse subcortical and periventricular T2 hyperintensities bilaterally. EXAM: CT ANGIOGRAPHY HEAD AND NECK TECHNIQUE: Multidetector CT imaging of the head and neck was performed using the standard protocol during bolus administration of intravenous contrast. Multiplanar CT image reconstructions and MIPs were obtained to evaluate the vascular anatomy. Carotid stenosis measurements (when applicable) are obtained utilizing NASCET criteria, using the distal internal carotid diameter as the denominator. CONTRAST:  75mL OMNIPAQUE IOHEXOL 350 MG/ML SOLN COMPARISON:  MRI brain 10/06/2018. CT head without contrast 10/06/2018 FINDINGS: CTA NECK FINDINGS Aortic arch: A 3 vessel arch configuration is present. No significant atherosclerotic changes present at the arch or great vessel origins. There is no aneurysm or focal stenosis. Right carotid system: Right common carotid artery is within normal limits. Minimal atherosclerotic calcifications are present at the right carotid bifurcation. There is no significant stenosis. The cervical right ICA is otherwise normal Left carotid system: The left common carotid artery is within normal limits. Bifurcation is unremarkable. Cervical left ICA is normal. Vertebral arteries: Vertebral arteries originate from the subclavian arteries bilaterally. The right vertebral artery is the dominant vessel. There is a 50% stenosis at the origin of the left vertebral artery. No tandem stenoses are present in either vertebral artery in the neck. Skeleton: Prominent disc osteophyte complex is present C6-7 and C7-T1. There is  likely central canal stenosis at the C6-7 level. Vertebral body heights are maintained. There is reversal of the normal cervical lordosis. No focal lytic or blastic lesions are present. Other neck: No focal mucosal or submucosal lesions are present. Salivary glands are within normal limits bilaterally. Upper chest: Lung apices are clear.  Thoracic inlet is normal. Review of the MIP images confirms the above findings CTA HEAD FINDINGS Anterior circulation: The internal carotid arteries are within normal limits from the skull base through the ICA termini bilaterally. A1 and M1 segments are normal. The anterior communicating artery is patent. MCA bifurcations are intact. The ACA and  MCA branch vessels are normal. Posterior circulation: Vertebral arteries are codominant at the skull base. PICA origins are visualized and normal. The vertebrobasilar junction is normal. Both posterior cerebral arteries originate from basilar tip. PCA branch vessels are normal. Venous sinuses: The dural sinuses are patent. Left transverse sinus is dominant. Straight sinus deep cerebral veins are intact. Cortical veins are unremarkable. Anatomic variants: None Review of the MIP images confirms the above findings IMPRESSION: 1. Normal CTA circle-of-Willis. No acute or focal vascular lesion to explain the patient's dizziness, nausea, or abnormal MRI findings. 2. Minimal atherosclerotic change at the right carotid bifurcation without significant stenosis. 3. Mild narrowing of the left vertebral artery origin without a significant stenosis relative to the more distal vessel. 4. Cervical disc disease is most evident at C6-7 where a prominent disc osteophyte complex results in central canal narrowing. Electronically Signed   By: Marin Roberts M.D.   On: 10/06/2018 07:15   Mr Brain Wo Contrast  Result Date: 10/06/2018 CLINICAL DATA:  Progressive vertigo over the last week. EXAM: MRI HEAD WITHOUT CONTRAST TECHNIQUE: Multiplanar, multiecho  pulse sequences of the brain and surrounding structures were obtained without intravenous contrast. COMPARISON:  CT head without contrast 10/06/2018 FINDINGS: Brain: Diffuse subcortical T2 hyperintensities are moderately advanced for age. Periventricular T2 hyperintensity is most prominent about the atria of the lateral ventricles. No acute infarct, hemorrhage, or mass lesion is present. The ventricles are of normal size. No significant extraaxial fluid collection is present. The internal auditory canals are within normal limits. The brainstem and cerebellum are within normal limits. Vascular: Flow is present in the major intracranial arteries. Skull and upper cervical spine: The craniocervical junction is normal. Upper cervical spine is within normal limits. Marrow signal is unremarkable. Sinuses/Orbits: The paranasal sinuses and mastoid air cells are clear. A remote left orbital blowout fracture is present. Globes and orbits are otherwise within normal limits. IMPRESSION: 1. Diffuse subcortical T2 hyperintensities are moderately advanced for age. The finding is nonspecific but can be seen in the setting of chronic microvascular ischemia, a demyelinating process such as multiple sclerosis, vasculitis, complicated migraine headaches, or as the sequelae of a prior infectious or inflammatory process. 2. Periventricular T2 hyperintensities focused about the atria of the lateral ventricles. This raises additional concern for a demyelinating process. 3. No acute intracranial abnormality.  No acute infarct. 4. Remote left medial orbital blowout fracture. Electronically Signed   By: Marin Roberts M.D.   On: 10/06/2018 05:14    Pending Labs Unresulted Labs (From admission, onward)    Start     Ordered   10/13/18 0500  Creatinine, serum  (enoxaparin (LOVENOX)    CrCl >/= 30 ml/min)  Weekly,   STAT    Comments:  while on enoxaparin therapy    10/06/18 0803   10/07/18 0500  Hemoglobin A1c  Tomorrow morning,    STAT     10/06/18 0803   10/07/18 0500  Lipid panel  Tomorrow morning,   STAT    Comments:  Fasting    10/06/18 0803   10/06/18 0800  HIV antibody (Routine Testing)  Once,   STAT     10/06/18 0803   10/06/18 0800  CBC  (enoxaparin (LOVENOX)    CrCl >/= 30 ml/min)  Once,   STAT    Comments:  Baseline for enoxaparin therapy IF NOT ALREADY DRAWN.  Notify MD if PLT < 100 K.    10/06/18 0803   10/06/18 0800  Creatinine, serum  (enoxaparin (LOVENOX)  CrCl >/= 30 ml/min)  Once,   STAT    Comments:  Baseline for enoxaparin therapy IF NOT ALREADY DRAWN.    10/06/18 0803          Vitals/Pain Today's Vitals   10/06/18 0521 10/06/18 0530 10/06/18 0600 10/06/18 0630  BP:  (!) 153/98 (!) 141/98 (!) 144/91  Pulse: 83 93 72 81  Resp: (!) 24 18 18  (!) 21  Temp:      TempSrc:      SpO2: 96% 98% 100% 98%  Weight:      Height:      PainSc:        Isolation Precautions Droplet and Contact precautions  Medications Medications   stroke: mapping our early stages of recovery book (has no administration in time range)  acetaminophen (TYLENOL) tablet 650 mg (has no administration in time range)    Or  acetaminophen (TYLENOL) solution 650 mg (has no administration in time range)    Or  acetaminophen (TYLENOL) suppository 650 mg (has no administration in time range)  enoxaparin (LOVENOX) injection 40 mg (has no administration in time range)  meclizine (ANTIVERT) tablet 12.5 mg (has no administration in time range)  meclizine (ANTIVERT) tablet 25 mg (25 mg Oral Given 10/06/18 0247)  sodium chloride 0.9 % bolus 1,000 mL (0 mLs Intravenous Stopped 10/06/18 0407)  ondansetron (ZOFRAN) injection 4 mg (4 mg Intravenous Given 10/06/18 0247)  iohexol (OMNIPAQUE) 350 MG/ML injection 75 mL (75 mLs Intravenous Contrast Given 10/06/18 0608)    Mobility walks Low fall risk   Focused Assessments see assessments   R Recommendations: See Admitting Provider Note  Report given to:   Additional Notes:  Pt still has mild dizziness at rest, worse with movement

## 2018-10-06 NOTE — ED Provider Notes (Signed)
[facility] Emergency Department Provider Note  ____________________________________________  Time seen: Approximately 2:41 AM  I have reviewed the triage vital signs and the nursing notes.   HISTORY  Chief Complaint Dizziness   HPI Jaime Moore is a 48 y.o. male with no significant past medical history who presents for evaluation of dizziness.  Patient reports that over the last 2 days he has had 4 episodes of dizziness which he describes as room spinning.  He reports that even with his eyes closed he continues to have the same sensation.  These episodes last several minutes and resolved without intervention. Episodes are usually associated with nausea.  This evening they were more pronounced and lasting longer which prompted his visit to the emergency room.  No headache, no slurred speech, no facial droop, no unilateral weakness or numbness, no chest pain or shortness of breath, no fever or chills, no vomiting or diarrhea, no abdominal pain, no neck pain.  Patient denies history of smoking or drug use, no personal or family history of stroke.  He denies tinnitus or decreased hearing.  He has been told in the past that his blood pressure is elevated but was never put on medication for it.  PMH None - reviewed  Prior to Admission medications   Medication Sig Start Date End Date Taking? Authorizing Provider  cyclobenzaprine (FLEXERIL) 10 MG tablet Take 1 tablet (10 mg total) by mouth 3 (three) times daily as needed. 09/17/18   Triplett, Rulon Eisenmengerari B, FNP  meloxicam (MOBIC) 15 MG tablet Take 1 tablet (15 mg total) by mouth daily. 09/17/18   Chinita Pesterriplett, Cari B, FNP    Allergies Patient has no known allergies.  FH No history of stroke  Social History Social History   Tobacco Use   Smoking status: Never Smoker   Smokeless tobacco: Never Used  Substance Use Topics   Alcohol use: No    Frequency: Never   Drug use: No    Review of Systems  Constitutional:  Negative for fever. Eyes: Negative for visual changes. ENT: Negative for sore throat. Neck: No neck pain  Cardiovascular: Negative for chest pain. Respiratory: Negative for shortness of breath. Gastrointestinal: Negative for abdominal pain, vomiting or diarrhea. Genitourinary: Negative for dysuria. Musculoskeletal: Negative for back pain. Skin: Negative for rash. Neurological: Negative for headaches, weakness or numbness. + vertigo Psych: No SI or HI  ____________________________________________   PHYSICAL EXAM:  VITAL SIGNS: ED Triage Vitals  Enc Vitals Group     BP 10/05/18 2236 (!) 174/96     Pulse Rate 10/05/18 2236 90     Resp 10/05/18 2236 18     Temp 10/05/18 2236 97.9 F (36.6 C)     Temp Source 10/05/18 2236 Oral     SpO2 10/05/18 2236 97 %     Weight 10/05/18 2236 205 lb (93 kg)     Height 10/05/18 2236 5\' 7"  (1.702 m)     Head Circumference --      Peak Flow --      Pain Score 10/05/18 2235 0     Pain Loc --      Pain Edu? --      Excl. in GC? --     Constitutional: Alert and oriented. Well appearing and in no apparent distress. HEENT:      Head: Normocephalic and atraumatic.         Eyes: Conjunctivae are normal. Sclera is non-icteric.       Ears: bilateral wax impaction  Mouth/Throat: Mucous membranes are moist.       Neck: Supple with no signs of meningismus. Cardiovascular: Regular rate and rhythm. No murmurs, gallops, or rubs. 2+ symmetrical distal pulses are present in all extremities. No JVD. Respiratory: Normal respiratory effort. Lungs are clear to auscultation bilaterally. No wheezes, crackles, or rhonchi.  Gastrointestinal: Soft, non tender, and non distended with positive bowel sounds. No rebound or guarding. Musculoskeletal: Nontender with normal range of motion in all extremities. No edema, cyanosis, or erythema of extremities. Neurologic: Normal speech and language. A & O x3, PERRL, EOMI, no nystagmus, CN II-XII intact, motor testing  reveals good tone and bulk throughout. There is no evidence of pronator drift or dysmetria. Muscle strength is 5/5 throughout. Deep tendon reflexes are 2+ throughout with downgoing toes. Sensory examination is intact. Gait is normal. Skin: Skin is warm, dry and intact. No rash noted. Psychiatric: Mood and affect are normal. Speech and behavior are normal.  ____________________________________________   LABS (all labs ordered are listed, but only abnormal results are displayed)  Labs Reviewed  CBC WITH DIFFERENTIAL/PLATELET - Abnormal; Notable for the following components:      Result Value   WBC 11.7 (*)    All other components within normal limits  COMPREHENSIVE METABOLIC PANEL - Abnormal; Notable for the following components:   Glucose, Bld 121 (*)    Total Bilirubin 1.3 (*)    All other components within normal limits  URINALYSIS, COMPLETE (UACMP) WITH MICROSCOPIC - Abnormal; Notable for the following components:   Color, Urine YELLOW (*)    APPearance HAZY (*)    Ketones, ur 5 (*)    Protein, ur 30 (*)    All other components within normal limits  TROPONIN I   ____________________________________________  EKG  ED ECG REPORT I, Rudene Re, the attending physician, personally viewed and interpreted this ECG.  Normal sinus rhythm with first-degree AV block, rate of 80, normal QTC, normal axis, no ST elevations or depressions.  Unchanged from prior ____________________________________________  RADIOLOGY  I have personally reviewed the images performed during this visit and I agree with the Radiologist's read.   Interpretation by Radiologist:  Ct Head Wo Contrast  Result Date: 10/06/2018 CLINICAL DATA:  48 year old male with acute dizziness. EXAM: CT HEAD WITHOUT CONTRAST TECHNIQUE: Contiguous axial images were obtained from the base of the skull through the vertex without intravenous contrast. COMPARISON:  None. FINDINGS: Brain: No midline shift, mass effect, or  evidence of intracranial mass lesion. No ventriculomegaly. Gray-white matter differentiation is within normal limits throughout the brain. No cortically based acute infarct identified. No acute intracranial hemorrhage identified. Vascular: No suspicious intracranial vascular hyperdensity. Skull: Partially visible small chronic left lamina papyracea fracture. No acute osseous abnormality identified. Sinuses/Orbits: Visualized paranasal sinuses and mastoids are well pneumatized. Other: No acute orbit or scalp soft tissue finding. IMPRESSION: Negative noncontrast CT appearance of the brain. Electronically Signed   By: Genevie Ann M.D.   On: 10/06/2018 03:10   Mr Brain Wo Contrast  Result Date: 10/06/2018 CLINICAL DATA:  Progressive vertigo over the last week. EXAM: MRI HEAD WITHOUT CONTRAST TECHNIQUE: Multiplanar, multiecho pulse sequences of the brain and surrounding structures were obtained without intravenous contrast. COMPARISON:  CT head without contrast 10/06/2018 FINDINGS: Brain: Diffuse subcortical T2 hyperintensities are moderately advanced for age. Periventricular T2 hyperintensity is most prominent about the atria of the lateral ventricles. No acute infarct, hemorrhage, or mass lesion is present. The ventricles are of normal size. No significant extraaxial fluid collection  is present. The internal auditory canals are within normal limits. The brainstem and cerebellum are within normal limits. Vascular: Flow is present in the major intracranial arteries. Skull and upper cervical spine: The craniocervical junction is normal. Upper cervical spine is within normal limits. Marrow signal is unremarkable. Sinuses/Orbits: The paranasal sinuses and mastoid air cells are clear. A remote left orbital blowout fracture is present. Globes and orbits are otherwise within normal limits. IMPRESSION: 1. Diffuse subcortical T2 hyperintensities are moderately advanced for age. The finding is nonspecific but can be seen in the  setting of chronic microvascular ischemia, a demyelinating process such as multiple sclerosis, vasculitis, complicated migraine headaches, or as the sequelae of a prior infectious or inflammatory process. 2. Periventricular T2 hyperintensities focused about the atria of the lateral ventricles. This raises additional concern for a demyelinating process. 3. No acute intracranial abnormality.  No acute infarct. 4. Remote left medial orbital blowout fracture. Electronically Signed   By: Marin Robertshristopher  Mattern M.D.   On: 10/06/2018 05:14      ____________________________________________   PROCEDURES  Procedure(s) performed: None Procedures Critical Care performed:  None ____________________________________________   INITIAL IMPRESSION / ASSESSMENT AND PLAN / ED COURSE   48 y.o. male with no significant past medical history who presents for evaluation of intermittent dizziness/ vertigo x 2 days.  Patient is completely neurologically intact on exam with normal gait, does have bilateral ear wax impaction. EKG with no dysrhythmias or ischemia.  Differential diagnoses including peripheral vertigo versus stroke.  Labs, orthostatic vital signs, and head CT are pending.  Will give IV fluids and meclizine.    _________________________ 3:37 AM on 10/06/2018 -----------------------------------------  Negative head CT.  Patient remains dizzy after meclizine and fluids.  Will get an MRI.  _________________________ 5:29 AM on 10/06/2018 -----------------------------------------  MRI is positive for diffuse subcortical hyperdensities concerning for vasculitis versus demyelinating disease. Discussed with teleneuro who recommended CTA of the head and neck to eval for vasculitis and admission for further evaluation. Discussed these recs with patient who is in agreement. Will consult hospitalist for admission.    As part of my medical decision making, I reviewed the following data within the electronic medical  record:  Nursing notes reviewed and incorporated, Labs reviewed , Old chart reviewed, Radiograph reviewed , Discussed with admitting physician , A consult was requested and obtained from this/these consultant(s) Neurology, Notes from prior ED visits and [place] Controlled Substance Database    Pertinent labs & imaging results that were available during my care of the patient were reviewed by me and considered in my medical decision making (see chart for details).    ____________________________________________   FINAL CLINICAL IMPRESSION(S) / ED DIAGNOSES  Final diagnoses:  Vertigo  Abnormal MRI      NEW MEDICATIONS STARTED DURING THIS VISIT:  ED Discharge Orders    None       Note:  This document was prepared using Dragon voice recognition software and may include unintentional dictation errors.    Nita SickleVeronese, Mendes, MD 10/17/18 [phone]

## 2018-10-06 NOTE — Progress Notes (Signed)
SLP Cancellation Note  Patient Details Name: Jaime Moore MRN: [id number] DOB: [birth year]   Cancelled treatment:       Reason Eval/Treat Not Completed: SLP screened, no needs identified, will sign off(chart reviewed; consulted NSG then met w/ pt).  Pt denied any difficulty swallowing and is currently on a regular diet post passing the [place] swallow screen in the ED; tolerates swallowing pills w/ water per NSG. Pt conversed at conversational level w/out deficits noted; pt and NSG staff denied any speech-language deficits.  No further skilled ST services indicated as pt appears at his baseline. Pt agreed. NSG to reconsult if any change in status while admitted.     Orinda Kenner, MS, CCC-SLP Watson,Katherine 10/06/2018, 11:20 AM

## 2018-10-06 NOTE — ED Notes (Signed)
Pt to MRI

## 2018-10-06 NOTE — TOC Initial Note (Signed)
Transition of Care [facility]) - Initial/Assessment Note    Patient Details  Name: Jaime Moore MRN: [id number] Date of Birth: 06-04-1970  [facility] CM/SW Contact:    Shelbie Hutching, RN Phone Number: 10/06/2018, 10:28 AM  Clinical Narrative:                 Patient is being seen in the emergency room for dizziness and nausea for 1 week.  Patient is being placed in observation.  Patient is from home where he lives with his 2 brothers.  Patient is not currently working and does not have any insurance or PCP.  Patient does drive.  Resources provided for primary care providers and medication assistance.  Number for [facility] Physician referral line given [phone] and [facility] book of low cost health care in [facility].  Referral sent to [facility] and [facility] with patient permission.  Patient given application for ODC/MM.    Expected Discharge Plan: Home/Self Care Barriers to Discharge: Continued Medical Work up   Patient Goals and CMS Choice Patient states their goals for this hospitalization and ongoing recovery are:: Find out what is causing this dizziness       Expected Discharge Plan and Services Expected Discharge Plan: Home/Self Care   Discharge Planning Services: CM Consult, [facility], Medication Assistance   Living arrangements for the past 2 months: Single Family Home                                      Prior Living Arrangements/Services Living arrangements for the past 2 months: Single Family Home Lives with:: Siblings Patient language and need for interpreter reviewed:: No        Need for Family Participation in Patient Care: No (Comment) Care giver support system in place?: Yes (comment)(Brothers )   Criminal Activity/Legal Involvement Pertinent to Current Situation/Hospitalization: No - Comment as needed  Activities of Daily Living      Permission Sought/Granted Permission sought to share information with :  Case Manager, Other (comment) Permission granted to share information with : Yes, Verbal Permission Granted     Permission granted to share info w AGENCY: [facility]         Emotional Assessment Appearance:: Appears stated age Attitude/Demeanor/Rapport: Engaged Affect (typically observed): Accepting Orientation: : Oriented to Self, Oriented to Place, Oriented to Situation, Oriented to  Time Alcohol / Substance Use: Not Applicable Psych Involvement: No (comment)  Admission diagnosis:  Dizzy  Nausea Patient Active Problem List   Diagnosis Date Noted  . Dizziness 10/06/2018   PCP:  Patient, No Pcp Per Pharmacy:   [id number] - [place], [address] [place] [address] Phone: [phone] Fax: [phone]     Social Determinants of Health (SDOH) Interventions    Readmission Risk Interventions No flowsheet data found.

## 2018-10-06 NOTE — H&P (Signed)
[facility] [place] at [facility]   PATIENT NAME: Jaime NgoJerry Moore    MR#:  [id number]  DATE OF BIRTH:  10/30/1970  DATE OF ADMISSION:  10/06/2018  PRIMARY CARE PHYSICIAN: Patient, No Pcp Per   REQUESTING/REFERRING PHYSICIAN: Dr. Daryel NovemberJonathan Williams  CHIEF COMPLAINT: Dizziness   Chief Complaint  Patient presents with  . Dizziness    HISTORY OF PRESENT ILLNESS:  Jaime Moore  is a 48 y.o. male with last medical history and does not have a PCP comes in because of dizziness with associated with nausea.  Patient feels dizzy only when he moves the head.  Denies any weakness in legs or hands but complains of generalized weakness.  Also says he has blurred vision for 1 week.  No chest pain, cough, fever.  COVID-19 test has been sent to [facility], he will be on contact, droplet precaution patient has no other complaints.  Patient CT head, CT Angie of neck are within normal range, MRI of the brain this morning showed T2 hyper intense signal nonspecific but can be seen in microvascular ischemia/demyelinating process concerning this we are admitting the patient.  PAST MEDICAL HISTORY:  History reviewed. No pertinent past medical history.  PAST SURGICAL HISTOIRY:  History reviewed. No pertinent surgical history.  SOCIAL HISTORY:   Social History   Tobacco Use  . Smoking status: Never Smoker  . Smokeless tobacco: Never Used  Substance Use Topics  . Alcohol use: No    Frequency: Never    FAMILY HISTORY:  No family history on file.  Patient denies any family history of high blood pressure or diabetes.  DRUG ALLERGIES:  No Known Allergies  REVIEW OF SYSTEMS:  CONSTITUTIONAL: No fever, fatigue or weakness.  EYES: No blurred or double vision.  EARS, NOSE, AND THROAT: No tinnitus or ear pain.  RESPIRATORY: No cough, shortness of breath, wheezing or hemoptysis.  CARDIOVASCULAR: No chest pain, orthopnea, edema.  GASTROINTESTINAL: No nausea, vomiting, diarrhea or  abdominal pain.  GENITOURINARY: No dysuria, hematuria.  ENDOCRINE: No polyuria, nocturia,  HEMATOLOGY: No anemia, easy bruising or bleeding SKIN: No rash or lesion. MUSCULOSKELETAL: No joint pain or arthritis.   NEUROLOGIC: No tingling, numbness, weakness.  Patient feels dizzy for 1 week associated with nausea and has generalized weakness, no tingling or numbness in her arms or legs or the face. PSYCHIATRY: No anxiety or depression.   MEDICATIONS AT HOME:   Prior to Admission medications   Medication Sig Start Date End Date Taking? Authorizing Provider  cyclobenzaprine (FLEXERIL) 10 MG tablet Take 1 tablet (10 mg total) by mouth 3 (three) times daily as needed. 09/17/18  Yes Triplett, Cari B, FNP  meloxicam (MOBIC) 15 MG tablet Take 1 tablet (15 mg total) by mouth daily. 09/17/18  Yes Triplett, Cari B, FNP      VITAL SIGNS:  Blood pressure (!) 144/91, pulse 81, temperature 97.9 F (36.6 C), temperature source Oral, resp. rate (!) 21, height 5\' 7"  (1.702 m), weight 93 kg, SpO2 98 %.  PHYSICAL EXAMINATION:  GENERAL:  48 y.o.-year-old patient lying in the bed with no acute distress.  EYES: Pupils equal, round, reactive to light and accommodation. No scleral icterus. Extraocular muscles intact.  HEENT: Head atraumatic, normocephalic. Oropharynx and nasopharynx clear.  NECK:  Supple, no jugular venous distention. No thyroid enlargement, no tenderness.  LUNGS: Normal breath sounds bilaterally, no wheezing, rales,rhonchi or crepitation. No use of accessory muscles of respiration.  CARDIOVASCULAR: S1, S2 normal. No murmurs, rubs, or gallops.  ABDOMEN: Soft, nontender, nondistended. Bowel sounds present. No organomegaly or mass.  EXTREMITIES: No pedal edema, cyanosis, or clubbing.  NEUROLOGIC: Cranial nerves II through XII are intact. Muscle strength 5/5 in all extremities. Sensation intact. Gait not checked.  PSYCHIATRIC: The patient is alert and oriented x 3.  SKIN: No obvious rash, lesion,  or ulcer.   LABORATORY PANEL:   CBC Recent Labs  Lab 10/05/18 2238  WBC 11.7*  HGB 15.2  HCT 43.8  PLT 298   ------------------------------------------------------------------------------------------------------------------  Chemistries  Recent Labs  Lab 10/05/18 2238  NA 137  K 3.9  CL 102  CO2 22  GLUCOSE 121*  BUN 16  CREATININE 1.12  CALCIUM 9.4  AST 24  ALT 30  ALKPHOS 60  BILITOT 1.3*   ------------------------------------------------------------------------------------------------------------------  Cardiac Enzymes Recent Labs  Lab 10/05/18 2238  TROPONINI <0.03   ------------------------------------------------------------------------------------------------------------------  RADIOLOGY:  Ct Angio Head W Or Wo Contrast  Result Date: 10/06/2018 CLINICAL DATA:  Dizziness and nausea for 1 week. Vertigo, persistent, central. Abnormal MRI with diffuse subcortical and periventricular T2 hyperintensities bilaterally. EXAM: CT ANGIOGRAPHY HEAD AND NECK TECHNIQUE: Multidetector CT imaging of the head and neck was performed using the standard protocol during bolus administration of intravenous contrast. Multiplanar CT image reconstructions and MIPs were obtained to evaluate the vascular anatomy. Carotid stenosis measurements (when applicable) are obtained utilizing NASCET criteria, using the distal internal carotid diameter as the denominator. CONTRAST:  75mL OMNIPAQUE IOHEXOL 350 MG/ML SOLN COMPARISON:  MRI brain 10/06/2018. CT head without contrast 10/06/2018 FINDINGS: CTA NECK FINDINGS Aortic arch: A 3 vessel arch configuration is present. No significant atherosclerotic changes present at the arch or great vessel origins. There is no aneurysm or focal stenosis. Right carotid system: Right common carotid artery is within normal limits. Minimal atherosclerotic calcifications are present at the right carotid bifurcation. There is no significant stenosis. The cervical right  ICA is otherwise normal Left carotid system: The left common carotid artery is within normal limits. Bifurcation is unremarkable. Cervical left ICA is normal. Vertebral arteries: Vertebral arteries originate from the subclavian arteries bilaterally. The right vertebral artery is the dominant vessel. There is a 50% stenosis at the origin of the left vertebral artery. No tandem stenoses are present in either vertebral artery in the neck. Skeleton: Prominent disc osteophyte complex is present C6-7 and C7-T1. There is likely central canal stenosis at the C6-7 level. Vertebral body heights are maintained. There is reversal of the normal cervical lordosis. No focal lytic or blastic lesions are present. Other neck: No focal mucosal or submucosal lesions are present. Salivary glands are within normal limits bilaterally. Upper chest: Lung apices are clear.  Thoracic inlet is normal. Review of the MIP images confirms the above findings CTA HEAD FINDINGS Anterior circulation: The internal carotid arteries are within normal limits from the skull base through the ICA termini bilaterally. A1 and M1 segments are normal. The anterior communicating artery is patent. MCA bifurcations are intact. The ACA and MCA branch vessels are normal. Posterior circulation: Vertebral arteries are codominant at the skull base. PICA origins are visualized and normal. The vertebrobasilar junction is normal. Both posterior cerebral arteries originate from basilar tip. PCA branch vessels are normal. Venous sinuses: The dural sinuses are patent. Left transverse sinus is dominant. Straight sinus deep cerebral veins are intact. Cortical veins are unremarkable. Anatomic variants: None Review of the MIP images confirms the above findings IMPRESSION: 1. Normal CTA circle-of-Willis. No acute or focal vascular lesion to explain the patient's dizziness,  nausea, or abnormal MRI findings. 2. Minimal atherosclerotic change at the right carotid bifurcation without  significant stenosis. 3. Mild narrowing of the left vertebral artery origin without a significant stenosis relative to the more distal vessel. 4. Cervical disc disease is most evident at C6-7 where a prominent disc osteophyte complex results in central canal narrowing. Electronically Signed   By: San Morelle M.D.   On: 10/06/2018 07:15   Ct Head Wo Contrast  Result Date: 10/06/2018 CLINICAL DATA:  48 year old male with acute dizziness. EXAM: CT HEAD WITHOUT CONTRAST TECHNIQUE: Contiguous axial images were obtained from the base of the skull through the vertex without intravenous contrast. COMPARISON:  None. FINDINGS: Brain: No midline shift, mass effect, or evidence of intracranial mass lesion. No ventriculomegaly. Gray-white matter differentiation is within normal limits throughout the brain. No cortically based acute infarct identified. No acute intracranial hemorrhage identified. Vascular: No suspicious intracranial vascular hyperdensity. Skull: Partially visible small chronic left lamina papyracea fracture. No acute osseous abnormality identified. Sinuses/Orbits: Visualized paranasal sinuses and mastoids are well pneumatized. Other: No acute orbit or scalp soft tissue finding. IMPRESSION: Negative noncontrast CT appearance of the brain. Electronically Signed   By: Genevie Ann M.D.   On: 10/06/2018 03:10   Ct Angio Neck W And/or Wo Contrast  Result Date: 10/06/2018 CLINICAL DATA:  Dizziness and nausea for 1 week. Vertigo, persistent, central. Abnormal MRI with diffuse subcortical and periventricular T2 hyperintensities bilaterally. EXAM: CT ANGIOGRAPHY HEAD AND NECK TECHNIQUE: Multidetector CT imaging of the head and neck was performed using the standard protocol during bolus administration of intravenous contrast. Multiplanar CT image reconstructions and MIPs were obtained to evaluate the vascular anatomy. Carotid stenosis measurements (when applicable) are obtained utilizing NASCET criteria, using  the distal internal carotid diameter as the denominator. CONTRAST:  32mL OMNIPAQUE IOHEXOL 350 MG/ML SOLN COMPARISON:  MRI brain 10/06/2018. CT head without contrast 10/06/2018 FINDINGS: CTA NECK FINDINGS Aortic arch: A 3 vessel arch configuration is present. No significant atherosclerotic changes present at the arch or great vessel origins. There is no aneurysm or focal stenosis. Right carotid system: Right common carotid artery is within normal limits. Minimal atherosclerotic calcifications are present at the right carotid bifurcation. There is no significant stenosis. The cervical right ICA is otherwise normal Left carotid system: The left common carotid artery is within normal limits. Bifurcation is unremarkable. Cervical left ICA is normal. Vertebral arteries: Vertebral arteries originate from the subclavian arteries bilaterally. The right vertebral artery is the dominant vessel. There is a 50% stenosis at the origin of the left vertebral artery. No tandem stenoses are present in either vertebral artery in the neck. Skeleton: Prominent disc osteophyte complex is present C6-7 and C7-T1. There is likely central canal stenosis at the C6-7 level. Vertebral body heights are maintained. There is reversal of the normal cervical lordosis. No focal lytic or blastic lesions are present. Other neck: No focal mucosal or submucosal lesions are present. Salivary glands are within normal limits bilaterally. Upper chest: Lung apices are clear.  Thoracic inlet is normal. Review of the MIP images confirms the above findings CTA HEAD FINDINGS Anterior circulation: The internal carotid arteries are within normal limits from the skull base through the ICA termini bilaterally. A1 and M1 segments are normal. The anterior communicating artery is patent. MCA bifurcations are intact. The ACA and MCA branch vessels are normal. Posterior circulation: Vertebral arteries are codominant at the skull base. PICA origins are visualized and  normal. The vertebrobasilar junction is normal. Both posterior  cerebral arteries originate from basilar tip. PCA branch vessels are normal. Venous sinuses: The dural sinuses are patent. Left transverse sinus is dominant. Straight sinus deep cerebral veins are intact. Cortical veins are unremarkable. Anatomic variants: None Review of the MIP images confirms the above findings IMPRESSION: 1. Normal CTA circle-of-Willis. No acute or focal vascular lesion to explain the patient's dizziness, nausea, or abnormal MRI findings. 2. Minimal atherosclerotic change at the right carotid bifurcation without significant stenosis. 3. Mild narrowing of the left vertebral artery origin without a significant stenosis relative to the more distal vessel. 4. Cervical disc disease is most evident at C6-7 where a prominent disc osteophyte complex results in central canal narrowing. Electronically Signed   By: Marin Robertshristopher  Mattern M.D.   On: 10/06/2018 07:15   Mr Brain Wo Contrast  Result Date: 10/06/2018 CLINICAL DATA:  Progressive vertigo over the last week. EXAM: MRI HEAD WITHOUT CONTRAST TECHNIQUE: Multiplanar, multiecho pulse sequences of the brain and surrounding structures were obtained without intravenous contrast. COMPARISON:  CT head without contrast 10/06/2018 FINDINGS: Brain: Diffuse subcortical T2 hyperintensities are moderately advanced for age. Periventricular T2 hyperintensity is most prominent about the atria of the lateral ventricles. No acute infarct, hemorrhage, or mass lesion is present. The ventricles are of normal size. No significant extraaxial fluid collection is present. The internal auditory canals are within normal limits. The brainstem and cerebellum are within normal limits. Vascular: Flow is present in the major intracranial arteries. Skull and upper cervical spine: The craniocervical junction is normal. Upper cervical spine is within normal limits. Marrow signal is unremarkable. Sinuses/Orbits: The  paranasal sinuses and mastoid air cells are clear. A remote left orbital blowout fracture is present. Globes and orbits are otherwise within normal limits. IMPRESSION: 1. Diffuse subcortical T2 hyperintensities are moderately advanced for age. The finding is nonspecific but can be seen in the setting of chronic microvascular ischemia, a demyelinating process such as multiple sclerosis, vasculitis, complicated migraine headaches, or as the sequelae of a prior infectious or inflammatory process. 2. Periventricular T2 hyperintensities focused about the atria of the lateral ventricles. This raises additional concern for a demyelinating process. 3. No acute intracranial abnormality.  No acute infarct. 4. Remote left medial orbital blowout fracture. Electronically Signed   By: Marin Robertshristopher  Mattern M.D.   On: 10/06/2018 05:14    EKG:   Orders placed or performed during the hospital encounter of 10/06/18  . ED EKG  . ED EKG    IMPRESSION AND PLAN:   48 year old male patient with no past medical history and has not had any health checkup till now and has no PCP comes in because of dizziness for 1 week associated with nausea. 1.  Acute dizziness with nausea likely positional vertigo but because MRI of the brain showed possible demyelinating disease admitting the patient for further evaluation.  Patient will get echocardiogram, also will be seen by neurology.  Patient case discussed with telemetry neurology recommended CT angios neck which was unremarkable. 2.  Low risk for COVID-19, patient had no fever, no other sick contacts, no shortness of breath or chest pain.  Lungs are clear, hemodynamically stable, COVID-19 test [facility] to [facility] ~we get the result patient will be on droplet, contact precaution.    All the records are reviewed and case discussed with ED provider. Management plans discussed with the patient, family and they are in agreement.  CODE STATUS: Full code  TOTAL TIME TAKING  CARE OF THIS PATIENT: 55 minutes.  Katha HammingSnehalatha Cadell Gabrielson M.D on 10/06/2018 at 8:08 AM  Between 7am to 6pm - Pager - [phone]  After 6pm go to [email or web address] - password [email or web address]  [facility]  Office  [phone]  CC: Primary care physician; Patient, No Pcp Per  Note: This dictation was prepared with Dragon dictation along with smaller phrase technology. Any transcriptional errors that result from this process are unintentional.

## 2018-10-06 NOTE — Evaluation (Signed)
Occupational Therapy Evaluation Patient Details Name: Jaime Moore MRN: [id number] DOB: 03/23/1971 Today's Date: 10/06/2018    History of Present Illness Jaime Moore is a 48yo male [facility] c c/o of dizziness, mostly associated with positional changes in and to/from bed. 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 Pt reports symptoms typically  resolve after 30 seconds, but he deciided to come in after one of the more recent exacerbations lasted longer than 60 seconds. Pt confirms 'room spinning' sensation when symptoms happen, as well as "queasiness."    Clinical Impression   Pt seen for OT evaluation this date. 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  Pt lives with his brothers in a 1 story home with 5 steps and bilat rails. Pt has a standard height toilet and tub/shower. Currently pt demonstrates mild impairments in dizziness resulting in an increased risk of falls during functional mobility and ADL tasks, requiring modified independence for ADL tasks. Pt educated in positioning modifications (personal and environmental) for ADL tasks to minimize dizziness and educated in bathroom equipment (shower chair and handheld shower head) that may increase pt's safety while performing. Pt verbalized understanding. No additional skilled OT need identified at this time. Will sign off. Please re-consult if additional needs arise.     Follow Up Recommendations  No OT follow up    Equipment Recommendations  Tub/shower seat    Recommendations for Other Services       Precautions / Restrictions Precautions Precautions: None Restrictions Weight Bearing Restrictions: No      Mobility Bed Mobility Overal bed mobility: Independent                Transfers Overall transfer level: Independent                    Balance Overall balance assessment: Independent;No apparent balance deficits (not formally  assessed)                                         ADL either performed or assessed with clinical judgement   ADL Overall ADL's : Modified independent                                       General ADL Comments: With small modifications to LB ADL tasks to minimize bending/positional changes, pt able to perform safely without assist     Vision Baseline Vision/History: No visual deficits Patient Visual Report: No change from baseline(pt endorses having slightly blurry vision when he has been dizzy but it resolves; no vision deficits at the time of the OT evaluation) Vision Assessment?: No apparent visual deficits     Perception     Praxis      Pertinent Vitals/Pain Pain Assessment: No/denies pain     Hand Dominance     Extremity/Trunk Assessment Upper Extremity Assessment Upper Extremity Assessment: Overall WFL for tasks assessed   Lower Extremity Assessment Lower Extremity Assessment: Overall WFL for tasks assessed   Cervical / Trunk Assessment Cervical / Trunk Assessment: Normal   Communication Communication Communication: No difficulties   Cognition Arousal/Alertness: Awake/alert Behavior During Therapy: WFL for tasks assessed/performed Overall Cognitive Status: Within Functional Limits for tasks assessed  General Comments       Exercises Other Exercises Other Exercises: pt educated in positioning modifications (personal and environmental) for ADL tasks to minimize dizziness Other Exercises: pt educated in bathroom equipment (shower chair and handheld shower head) that may increase pt's safety while performing   Shoulder Instructions      Home Living Family/patient expects to be discharged to:: Private residence Living Arrangements: Other relatives Available Help at Discharge: Family Type of Home: Mobile home   Entrance Stairs-Number of Steps: 5 Entrance Stairs-Rails: Can  reach both Home Layout: One level     Bathroom Shower/Tub: Teacher, early years/pre: [place]: None          Prior Functioning/Environment Level of Independence: Independent        Comments: pt indep with ADL, IADL, works as a Dealer.         OT Problem List: Impaired balance (sitting and/or standing);Other (comment)(dizziness)      OT Treatment/Interventions:      OT Goals(Current goals can be found in the care plan section) Acute Rehab OT Goals Patient Stated Goal: decrease dizziness OT Goal Formulation: All assessment and education complete, DC therapy  OT Frequency:     Barriers to D/C:            Co-evaluation              AM-PAC OT "6 Clicks" Daily Activity     Outcome Measure Help from another person eating meals?: None Help from another person taking care of personal grooming?: None Help from another person toileting, which includes using toliet, bedpan, or urinal?: None Help from another person bathing (including washing, rinsing, drying)?: None Help from another person to put on and taking off regular upper body clothing?: None Help from another person to put on and taking off regular lower body clothing?: None 6 Click Score: 24   End of Session    Activity Tolerance: Patient tolerated treatment well Patient left: in chair;with call bell/phone within reach  OT Visit Diagnosis: Other abnormalities of gait and mobility (R26.89);Dizziness and giddiness (R42)                Time: 1350-1401 OT Time Calculation (min): 11 min Charges:  OT General Charges $OT Visit: 1 Visit OT Evaluation $OT Eval Low Complexity: 1 Low  Jeni Salles, MPH, MS, OTR/L ascom [phone] 10/06/18, 2:17 PM

## 2018-10-07 DIAGNOSIS — R42 Dizziness and giddiness: Secondary | ICD-10-CM

## 2018-10-07 LAB — LIPID PANEL
Cholesterol: 203 mg/dL — ABNORMAL HIGH (ref 0–200)
HDL: 38 mg/dL — ABNORMAL LOW (ref >40)
LDL Cholesterol: 148 mg/dL — ABNORMAL HIGH (ref 0–99)
Total CHOL/HDL Ratio: 5.3 RATIO
Triglycerides: 85 mg/dL (ref <150)
VLDL: 17 mg/dL (ref 0–40)

## 2018-10-07 LAB — HEMOGLOBIN A1C
Hgb A1c MFr Bld: 5.2 % (ref 4.8–5.6)
Mean Plasma Glucose: 102.54 mg/dL

## 2018-10-07 LAB — ECHOCARDIOGRAM COMPLETE
Height: 67 in
Weight: 3224 oz

## 2018-10-07 LAB — HIV ANTIBODY (ROUTINE TESTING W REFLEX): HIV Screen 4th Generation wRfx: NONREACTIVE

## 2018-10-07 MED ORDER — SIMVASTATIN 10 MG PO TABS: 10.0000 mg | ORAL_TABLET | Freq: Every day | ORAL | 0 refills | Status: DC

## 2018-10-07 MED ORDER — MECLIZINE HCL 12.5 MG PO TABS: 12.5000 mg | ORAL_TABLET | Freq: Two times a day (BID) | ORAL | 0 refills | Status: DC | PRN

## 2018-10-07 MED ORDER — SIMVASTATIN 10 MG PO TABS
10.0000 mg | ORAL_TABLET | Freq: Every day | ORAL | Status: DC
  Filled 2018-10-07: qty 1

## 2018-10-07 MED ORDER — AMLODIPINE BESYLATE 10 MG PO TABS: 10.0000 mg | ORAL_TABLET | Freq: Every day | ORAL | 0 refills | Status: DC

## 2018-10-07 NOTE — Discharge Summary (Signed)
Jaime Moore, is a 48 y.o. male  DOB August 21, 1970  MRN [id number].  Admission date:  10/06/2018  Admitting Physician  Katha Hamming, MD  Discharge Date:  10/07/2018   Primary MD  Patient, No Pcp Per  Recommendations for primary care physician for things to follow:  Patient has no PCP, we are trying to get a new PCP for follow-up of blood pressure, high cholesterol,   Admission Diagnosis  Vertigo [R42] Abnormal MRI [R93.89]   Discharge Diagnosis  Vertigo [R42] Abnormal MRI [R93.89]    Active Problems:   Dizziness      History reviewed. No pertinent past medical history.  History reviewed. No pertinent surgical history.     History of present illness and  Hospital Course:     Kindly see H&P for history of present illness and admission details, please review complete Labs, Consult reports and Test reports for all details in brief  HPI  from the history and physical done on the day of admission 48 year old male patient without any past medical history and does not have a PCP comes in because of dizziness that is going on for 1 week, patient had work-up in the emergency room including CT Angie of [place], CT head, MRI of the brain, MRI of the brain concerning for possible demyelination, tele-neurology recommended to admit the patient for possibLE myelinating disorder causing dizziness.   Hospital Course  #1. dizziness, it is more when he moves the head, dizziness thought to be positional due to positional vertigo, patient started on meclizine.  CT head, CT Angie of neck, MRI of the brain reviewed with Dr. Loretha Brasil, MRI brain images reviewed by neurology, he did not think patient has any demyelinating process going on, patient MRI of the brain changes are consistent with chronic hypertension.  Patient has no PCP, has no  medical problems before. 2.  Hyperlipidemia patient LDL is 148, started on statins.  Discussed the results with patient 3.  Essential hypertension, patient blood pressure is around 140/90, he has no history of high blood pressure, had not seen any  DOCTOR, will start Norvasc. Echocardiogram showed EF 60 to 65%. COVID-19 test negative. Discharge Condition: Stable   Follow UP      Discharge Instructions  and  Discharge Medications     Allergies as of 10/07/2018   No Known Allergies     Medication List    STOP taking these medications   cyclobenzaprine 10 MG tablet Commonly known as:  FLEXERIL   meloxicam 15 MG tablet Commonly known as:  MOBIC     TAKE these medications   amLODipine 10 MG tablet Commonly known as:  NORVASC Take 1 tablet (10 mg total) by mouth daily for 30 days.   meclizine 12.5 MG tablet Commonly known as:  ANTIVERT Take 1 tablet (12.5 mg total) by mouth 2 (two) times daily as needed for dizziness.   simvastatin 10 MG tablet Commonly known as:  ZOCOR Take 1 tablet (10 mg total) by mouth daily at 6 PM.         Diet and Activity recommendation: See Discharge Instructions above   Consults obtained -Neurology   Major procedures and Radiology Reports - PLEASE review detailed and final reports for all details, in brief -     Ct Angio Head W Or Wo Contrast  Result Date: 10/06/2018 CLINICAL DATA:  Dizziness and nausea for 1 week. Vertigo, persistent, central. Abnormal MRI with diffuse subcortical and periventricular T2 hyperintensities bilaterally. EXAM: CT ANGIOGRAPHY HEAD  AND NECK TECHNIQUE: Multidetector CT imaging of the head and neck was performed using the standard protocol during bolus administration of intravenous contrast. Multiplanar CT image reconstructions and MIPs were obtained to evaluate the vascular anatomy. Carotid stenosis measurements (when applicable) are obtained utilizing NASCET criteria, using the distal internal carotid diameter  as the denominator. CONTRAST:  75mL OMNIPAQUE IOHEXOL 350 MG/ML SOLN COMPARISON:  MRI brain 10/06/2018. CT head without contrast 10/06/2018 FINDINGS: CTA NECK FINDINGS Aortic arch: A 3 vessel arch configuration is present. No significant atherosclerotic changes present at the arch or great vessel origins. There is no aneurysm or focal stenosis. Right carotid system: Right common carotid artery is within normal limits. Minimal atherosclerotic calcifications are present at the right carotid bifurcation. There is no significant stenosis. The cervical right ICA is otherwise normal Left carotid system: The left common carotid artery is within normal limits. Bifurcation is unremarkable. Cervical left ICA is normal. Vertebral arteries: Vertebral arteries originate from the subclavian arteries bilaterally. The right vertebral artery is the dominant vessel. There is a 50% stenosis at the origin of the left vertebral artery. No tandem stenoses are present in either vertebral artery in the neck. Skeleton: Prominent disc osteophyte complex is present C6-7 and C7-T1. There is likely central canal stenosis at the C6-7 level. Vertebral body heights are maintained. There is reversal of the normal cervical lordosis. No focal lytic or blastic lesions are present. Other neck: No focal mucosal or submucosal lesions are present. Salivary glands are within normal limits bilaterally. Upper chest: Lung apices are clear.  Thoracic inlet is normal. Review of the MIP images confirms the above findings CTA HEAD FINDINGS Anterior circulation: The internal carotid arteries are within normal limits from the skull base through the ICA termini bilaterally. A1 and M1 segments are normal. The anterior communicating artery is patent. MCA bifurcations are intact. The ACA and MCA branch vessels are normal. Posterior circulation: Vertebral arteries are codominant at the skull base. PICA origins are visualized and normal. The vertebrobasilar junction is  normal. Both posterior cerebral arteries originate from basilar tip. PCA branch vessels are normal. Venous sinuses: The dural sinuses are patent. Left transverse sinus is dominant. Straight sinus deep cerebral veins are intact. Cortical veins are unremarkable. Anatomic variants: None Review of the MIP images confirms the above findings IMPRESSION: 1. Normal CTA circle-of-Willis. No acute or focal vascular lesion to explain the patient's dizziness, nausea, or abnormal MRI findings. 2. Minimal atherosclerotic change at the right carotid bifurcation without significant stenosis. 3. Mild narrowing of the left vertebral artery origin without a significant stenosis relative to the more distal vessel. 4. Cervical disc disease is most evident at C6-7 where a prominent disc osteophyte complex results in central canal narrowing. Electronically Signed   By: Marin Robertshristopher  Mattern M.D.   On: 10/06/2018 07:15   Dg Cervical Spine 2-3 Views  Result Date: 09/17/2018 CLINICAL DATA:  Neck pain left shoulder pain 2 weeks EXAM: CERVICAL SPINE - 2-3 VIEW COMPARISON:  None. FINDINGS: Normal alignment. No fracture or mass lesion. Mild disc degeneration and spurring C5-6 and C6-7. C7-T1 not well visualized due to overlying shoulders. Prevertebral soft tissues normal. IMPRESSION: Mild disc degeneration and spurring C5-6 and C6-7. Electronically Signed   By: Marlan Palauharles  Clark M.D.   On: 09/17/2018 10:04   Ct Head Wo Contrast  Result Date: 10/06/2018 CLINICAL DATA:  48 year old male with acute dizziness. EXAM: CT HEAD WITHOUT CONTRAST TECHNIQUE: Contiguous axial images were obtained from the base of the skull through the  vertex without intravenous contrast. COMPARISON:  None. FINDINGS: Brain: No midline shift, mass effect, or evidence of intracranial mass lesion. No ventriculomegaly. Gray-white matter differentiation is within normal limits throughout the brain. No cortically based acute infarct identified. No acute intracranial hemorrhage  identified. Vascular: No suspicious intracranial vascular hyperdensity. Skull: Partially visible small chronic left lamina papyracea fracture. No acute osseous abnormality identified. Sinuses/Orbits: Visualized paranasal sinuses and mastoids are well pneumatized. Other: No acute orbit or scalp soft tissue finding. IMPRESSION: Negative noncontrast CT appearance of the brain. Electronically Signed   By: Genevie Ann M.D.   On: 10/06/2018 03:10   Ct Angio Neck W And/or Wo Contrast  Result Date: 10/06/2018 CLINICAL DATA:  Dizziness and nausea for 1 week. Vertigo, persistent, central. Abnormal MRI with diffuse subcortical and periventricular T2 hyperintensities bilaterally. EXAM: CT ANGIOGRAPHY HEAD AND NECK TECHNIQUE: Multidetector CT imaging of the head and neck was performed using the standard protocol during bolus administration of intravenous contrast. Multiplanar CT image reconstructions and MIPs were obtained to evaluate the vascular anatomy. Carotid stenosis measurements (when applicable) are obtained utilizing NASCET criteria, using the distal internal carotid diameter as the denominator. CONTRAST:  66mL OMNIPAQUE IOHEXOL 350 MG/ML SOLN COMPARISON:  MRI brain 10/06/2018. CT head without contrast 10/06/2018 FINDINGS: CTA NECK FINDINGS Aortic arch: A 3 vessel arch configuration is present. No significant atherosclerotic changes present at the arch or great vessel origins. There is no aneurysm or focal stenosis. Right carotid system: Right common carotid artery is within normal limits. Minimal atherosclerotic calcifications are present at the right carotid bifurcation. There is no significant stenosis. The cervical right ICA is otherwise normal Left carotid system: The left common carotid artery is within normal limits. Bifurcation is unremarkable. Cervical left ICA is normal. Vertebral arteries: Vertebral arteries originate from the subclavian arteries bilaterally. The right vertebral artery is the dominant vessel.  There is a 50% stenosis at the origin of the left vertebral artery. No tandem stenoses are present in either vertebral artery in the neck. Skeleton: Prominent disc osteophyte complex is present C6-7 and C7-T1. There is likely central canal stenosis at the C6-7 level. Vertebral body heights are maintained. There is reversal of the normal cervical lordosis. No focal lytic or blastic lesions are present. Other neck: No focal mucosal or submucosal lesions are present. Salivary glands are within normal limits bilaterally. Upper chest: Lung apices are clear.  Thoracic inlet is normal. Review of the MIP images confirms the above findings CTA HEAD FINDINGS Anterior circulation: The internal carotid arteries are within normal limits from the skull base through the ICA termini bilaterally. A1 and M1 segments are normal. The anterior communicating artery is patent. MCA bifurcations are intact. The ACA and MCA branch vessels are normal. Posterior circulation: Vertebral arteries are codominant at the skull base. PICA origins are visualized and normal. The vertebrobasilar junction is normal. Both posterior cerebral arteries originate from basilar tip. PCA branch vessels are normal. Venous sinuses: The dural sinuses are patent. Left transverse sinus is dominant. Straight sinus deep cerebral veins are intact. Cortical veins are unremarkable. Anatomic variants: None Review of the MIP images confirms the above findings IMPRESSION: 1. Normal CTA circle-of-Willis. No acute or focal vascular lesion to explain the patient's dizziness, nausea, or abnormal MRI findings. 2. Minimal atherosclerotic change at the right carotid bifurcation without significant stenosis. 3. Mild narrowing of the left vertebral artery origin without a significant stenosis relative to the more distal vessel. 4. Cervical disc disease is most evident at C6-7 where  a prominent disc osteophyte complex results in central canal narrowing. Electronically Signed   By:  Marin Robertshristopher  Mattern M.D.   On: 10/06/2018 07:15   Mr Brain Wo Contrast  Result Date: 10/06/2018 CLINICAL DATA:  Progressive vertigo over the last week. EXAM: MRI HEAD WITHOUT CONTRAST TECHNIQUE: Multiplanar, multiecho pulse sequences of the brain and surrounding structures were obtained without intravenous contrast. COMPARISON:  CT head without contrast 10/06/2018 FINDINGS: Brain: Diffuse subcortical T2 hyperintensities are moderately advanced for age. Periventricular T2 hyperintensity is most prominent about the atria of the lateral ventricles. No acute infarct, hemorrhage, or mass lesion is present. The ventricles are of normal size. No significant extraaxial fluid collection is present. The internal auditory canals are within normal limits. The brainstem and cerebellum are within normal limits. Vascular: Flow is present in the major intracranial arteries. Skull and upper cervical spine: The craniocervical junction is normal. Upper cervical spine is within normal limits. Marrow signal is unremarkable. Sinuses/Orbits: The paranasal sinuses and mastoid air cells are clear. A remote left orbital blowout fracture is present. Globes and orbits are otherwise within normal limits. IMPRESSION: 1. Diffuse subcortical T2 hyperintensities are moderately advanced for age. The finding is nonspecific but can be seen in the setting of chronic microvascular ischemia, a demyelinating process such as multiple sclerosis, vasculitis, complicated migraine headaches, or as the sequelae of a prior infectious or inflammatory process. 2. Periventricular T2 hyperintensities focused about the atria of the lateral ventricles. This raises additional concern for a demyelinating process. 3. No acute intracranial abnormality.  No acute infarct. 4. Remote left medial orbital blowout fracture. Electronically Signed   By: Marin Robertshristopher  Mattern M.D.   On: 10/06/2018 05:14    Micro Results     Recent Results (from the past 240 hour(s))   SARS Coronavirus 2 (CEPHEID - Performed [facility]), Hosp Order     Status: None   Collection Time: 10/06/18  6:56 AM  Result Value Ref Range Status   SARS Coronavirus 2 NEGATIVE NEGATIVE Final    Comment: (NOTE) If result is NEGATIVE SARS-CoV-2 target nucleic acids are NOT DETECTED. The SARS-CoV-2 RNA is generally detectable in upper and lower  respiratory specimens during the acute phase of infection. The lowest  concentration of SARS-CoV-2 viral copies this assay can detect is 250  copies / mL. A negative result does not preclude SARS-CoV-2 infection  and should not be used as the sole basis for treatment or other  patient management decisions.  A negative result may occur with  improper specimen collection / handling, submission of specimen other  than nasopharyngeal swab, presence of viral mutation(s) within the  areas targeted by this assay, and inadequate number of viral copies  (<250 copies / mL). A negative result must be combined with clinical  observations, patient history, and epidemiological information. If result is POSITIVE SARS-CoV-2 target nucleic acids are DETECTED. The SARS-CoV-2 RNA is generally detectable in upper and lower  respiratory specimens dur ing the acute phase of infection.  Positive  results are indicative of active infection with SARS-CoV-2.  Clinical  correlation with patient history and other diagnostic information is  necessary to determine patient infection status.  Positive results do  not rule out bacterial infection or co-infection with other viruses. If result is PRESUMPTIVE POSTIVE SARS-CoV-2 nucleic acids MAY BE PRESENT.   A presumptive positive result was obtained on the submitted specimen  and confirmed on repeat testing.  While 2019 novel coronavirus  (SARS-CoV-2) nucleic acids may be  present in the submitted sample  additional confirmatory testing may be necessary for epidemiological  and / or clinical management  purposes  to differentiate between  SARS-CoV-2 and other Sarbecovirus currently known to infect humans.  If clinically indicated additional testing with an alternate test  methodology [phone]([id number]) is advised. The SARS-CoV-2 RNA is generally  detectable in upper and lower respiratory sp ecimens during the acute  phase of infection. The expected result is Negative. Fact Sheet for Patients:  BoilerBrush.com.cyhttps://www.fda.gov/media/136312/download Fact Sheet for Healthcare Providers: https://pope.com/https://www.fda.gov/media/136313/download This test is not yet approved or cleared by the [place] FDA and has been authorized for detection and/or diagnosis of SARS-CoV-2 by FDA under an Emergency Use Authorization (EUA).  This EUA will remain in effect (meaning this test can be used) for the duration of the COVID-19 declaration under Section 564(b)(1) of the Act, 21 U.S.C. section 360bbb-3(b)(1), unless the authorization is terminated or revoked sooner. Performed at [facility], [address] [place], [place] [address]        Today   Subjective:   Jaime NgoJerry Moore today has no headache,no chest abdominal pain,no new weakness tingling or numbness, feels much better wants to go home today.   Objective:   Blood pressure (!) 149/91, pulse 82, temperature 98.5 F (36.9 C), temperature source Oral, resp. rate 18, height 5\' 7"  (1.702 m), weight 91.4 kg, SpO2 98 %.   Intake/Output Summary (Last 24 hours) at 10/07/2018 1233 Last data filed at 10/07/2018 0932 Gross per 24 hour  Intake 480 ml  Output -  Net 480 ml    Exam Awake Alert, Oriented x 3, No new F.N deficits, Normal affect [place].AT,PERRAL Supple Neck,No JVD, No cervical lymphadenopathy appriciated.  Symmetrical Chest wall movement, Good air movement bilaterally, CTAB RRR,No Gallops,Rubs or new Murmurs, No Parasternal Heave +ve B.Sounds, Abd Soft, Non tender, No organomegaly appriciated, No rebound -guarding or rigidity. No Cyanosis, Clubbing or  edema, No new Rash or bruise  Data Review   CBC w Diff:  Lab Results  Component Value Date   WBC 10.5 10/06/2018   HGB 15.2 10/06/2018   HCT 44.5 10/06/2018   PLT 288 10/06/2018   LYMPHOPCT 26 10/05/2018   MONOPCT 8 10/05/2018   EOSPCT 0 10/05/2018   BASOPCT 0 10/05/2018    CMP:  Lab Results  Component Value Date   NA 137 10/05/2018   K 3.9 10/05/2018   CL 102 10/05/2018   CO2 22 10/05/2018   BUN 16 10/05/2018   CREATININE 0.95 10/06/2018   PROT 8.0 10/05/2018   ALBUMIN 4.7 10/05/2018   BILITOT 1.3 (H) 10/05/2018   ALKPHOS 60 10/05/2018   AST 24 10/05/2018   ALT 30 10/05/2018  .   Total Time in preparing paper work, data evaluation and todays exam - 35 minutes  Katha HammingSnehalatha Toshiba Null M.D on 10/07/2018 at 12:33 PM    Note: This dictation was prepared with Dragon dictation along with smaller phrase technology. Any transcriptional errors that result from this process are unintentional.

## 2018-10-07 NOTE — Progress Notes (Signed)
Patient discharged home per MD order. All discharge instructions given and all questions answered. 

## 2018-10-07 NOTE — Consult Note (Signed)
Reason for Consult: dizziness  Referring Physician: Dr. Luberta MutterKonidena    CC: dizziness   HPI: Jaime Moore is an 48 y.o. male with hx of HTN does not have a PCP comes in because of dizziness with associated with nausea.  Patient feels dizzy only when he moves the head. S/P eval and Eply's maneuver.  He was seen ambulating today.   History reviewed. No pertinent past medical history.  History reviewed. No pertinent surgical history.  History reviewed. No pertinent family history.  Social History:  reports that he has never smoked. He has never used smokeless tobacco. He reports that he does not drink alcohol or use drugs.  No Known Allergies  Medications: I have reviewed the patient's current medications.  ROS: History obtained from the patient  General ROS: negative for - chills, fatigue, fever, night sweats, weight gain or weight loss Psychological ROS: negative for - behavioral disorder, hallucinations, memory difficulties, mood swings or suicidal ideation Ophthalmic ROS: negative for - blurry vision, double vision, eye pain or loss of vision ENT ROS: negative for - epistaxis, nasal discharge, oral lesions, sore throat, tinnitus or vertigo Allergy and Immunology ROS: negative for - hives or itchy/watery eyes Hematological and Lymphatic ROS: negative for - bleeding problems, bruising or swollen lymph nodes Endocrine ROS: negative for - galactorrhea, hair pattern changes, polydipsia/polyuria or temperature intolerance Respiratory ROS: negative for - cough, hemoptysis, shortness of breath or wheezing Cardiovascular ROS: negative for - chest pain, dyspnea on exertion, edema or irregular heartbeat Gastrointestinal ROS: negative for - abdominal pain, diarrhea, hematemesis, nausea/vomiting or stool incontinence Genito-Urinary ROS: negative for - dysuria, hematuria, incontinence or urinary frequency/urgency Musculoskeletal ROS: negative for - joint swelling or muscular weakness Neurological ROS:  as noted in HPI Dermatological ROS: negative for rash and skin lesion changes  Physical Examination: Blood pressure (!) 149/91, pulse 82, temperature 98.5 F (36.9 C), temperature source Oral, resp. rate 18, height 5\' 7"  (1.702 m), weight 91.4 kg, SpO2 98 %.  Neurological Examination   Mental Status: Alert, oriented, thought content appropriate.  Speech fluent without evidence of aphasia.  Able to follow 3 step commands without difficulty. Cranial Nerves: II: Discs flat bilaterally; Visual fields grossly normal, pupils equal, round, reactive to light and accommodation III,IV, VI: ptosis not present, extra-ocular motions intact bilaterally V,VII: smile symmetric, facial light touch sensation normal bilaterally VIII: hearing normal bilaterally IX,X: gag reflex present XI: bilateral shoulder shrug XII: midline tongue extension Motor: Right : Upper extremity   5/5    Left:     Upper extremity   5/5  Lower extremity   5/5     Lower extremity   5/5 Tone and bulk:normal tone throughout; no atrophy noted Sensory: Pinprick and light touch intact throughout, bilaterally Deep Tendon Reflexes: 2+ and symmetric throughout Plantars: Right: downgoing   Left: downgoing Cerebellar: normal finger-to-nose, normal rapid alternating movements and normal heel-to-shin test Gait: seen ambulating on his own       Laboratory Studies:   Basic Metabolic Panel: Recent Labs  Lab 10/05/18 2238 10/06/18 1052  NA 137  --   K 3.9  --   CL 102  --   CO2 22  --   GLUCOSE 121*  --   BUN 16  --   CREATININE 1.12 0.95  CALCIUM 9.4  --     Liver Function Tests: Recent Labs  Lab 10/05/18 2238  AST 24  ALT 30  ALKPHOS 60  BILITOT 1.3*  PROT 8.0  ALBUMIN 4.7  No results for input(s): LIPASE, AMYLASE in the last 168 hours. No results for input(s): AMMONIA in the last 168 hours.  CBC: Recent Labs  Lab 10/05/18 2238 10/06/18 1052  WBC 11.7* 10.5  NEUTROABS 7.6  --   HGB 15.2 15.2  HCT 43.8  44.5  MCV 90.7 92.5  PLT 298 288    Cardiac Enzymes: Recent Labs  Lab 10/05/18 2238  TROPONINI <0.03    BNP: Invalid input(s): POCBNP  CBG: No results for input(s): GLUCAP in the last 168 hours.  Microbiology: Results for orders placed or performed during the hospital encounter of 10/06/18  SARS Coronavirus 2 (CEPHEID - Performed in [place] hospital lab), Hosp Order     Status: None   Collection Time: 10/06/18  6:56 AM  Result Value Ref Range Status   SARS Coronavirus 2 NEGATIVE NEGATIVE Final    Comment: (NOTE) If result is NEGATIVE SARS-CoV-2 target nucleic acids are NOT DETECTED. The SARS-CoV-2 RNA is generally detectable in upper and lower  respiratory specimens during the acute phase of infection. The lowest  concentration of SARS-CoV-2 viral copies this assay can detect is 250  copies / mL. A negative result does not preclude SARS-CoV-2 infection  and should not be used as the sole basis for treatment or other  patient management decisions.  A negative result may occur with  improper specimen collection / handling, submission of specimen other  than nasopharyngeal swab, presence of viral mutation(s) within the  areas targeted by this assay, and inadequate number of viral copies  (<250 copies / mL). A negative result must be combined with clinical  observations, patient history, and epidemiological information. If result is POSITIVE SARS-CoV-2 target nucleic acids are DETECTED. The SARS-CoV-2 RNA is generally detectable in upper and lower  respiratory specimens dur ing the acute phase of infection.  Positive  results are indicative of active infection with SARS-CoV-2.  Clinical  correlation with patient history and other diagnostic information is  necessary to determine patient infection status.  Positive results do  not rule out bacterial infection or co-infection with other viruses. If result is PRESUMPTIVE POSTIVE SARS-CoV-2 nucleic acids MAY BE PRESENT.    A presumptive positive result was obtained on the submitted specimen  and confirmed on repeat testing.  While 2019 novel coronavirus  (SARS-CoV-2) nucleic acids may be present in the submitted sample  additional confirmatory testing may be necessary for epidemiological  and / or clinical management purposes  to differentiate between  SARS-CoV-2 and other Sarbecovirus currently known to infect humans.  If clinically indicated additional testing with an alternate test  methodology [phone]) is advised. The SARS-CoV-2 RNA is generally  detectable in upper and lower respiratory sp ecimens during the acute  phase of infection. The expected result is Negative. Fact Sheet for Patients:  StrictlyIdeas.no Fact Sheet for Healthcare Providers: BankingDealers.co.za This test is not yet approved or cleared by the [place] FDA and has been authorized for detection and/or diagnosis of SARS-CoV-2 by FDA under an Emergency Use Authorization (EUA).  This EUA will remain in effect (meaning this test can be used) for the duration of the COVID-19 declaration under Section 564(b)(1) of the Act, 21 U.S.C. section 360bbb-3(b)(1), unless the authorization is terminated or revoked sooner. Performed at [facility], [address] [place], [place] [address]     Coagulation Studies: No results for input(s): LABPROT, INR in the last 72 hours.  Urinalysis:  Recent Labs  Lab 10/05/18 2238  Margaret*  LABSPEC 1.028  PHURINE 5.0  GLUCOSEU NEGATIVE  HGBUR NEGATIVE  BILIRUBINUR NEGATIVE  KETONESUR 5*  PROTEINUR 30*  NITRITE NEGATIVE  LEUKOCYTESUR NEGATIVE    Lipid Panel:     Component Value Date/Time   CHOL 203 (H) 10/07/2018 0653   TRIG 85 10/07/2018 0653   HDL 38 (L) 10/07/2018 0653   CHOLHDL 5.3 10/07/2018 0653   VLDL 17 10/07/2018 0653   LDLCALC 148 (H) 10/07/2018 0653    HgbA1C: No results found for: HGBA1C  Urine  Drug Screen:  No results found for: LABOPIA, COCAINSCRNUR, LABBENZ, AMPHETMU, THCU, LABBARB  Alcohol Level: No results for input(s): ETH in the last 168 hours.  Other results: EKG: normal EKG, normal sinus rhythm, unchanged from previous tracings.  Imaging: Ct Angio Head W Or Wo Contrast  Result Date: 10/06/2018 CLINICAL DATA:  Dizziness and nausea for 1 week. Vertigo, persistent, central. Abnormal MRI with diffuse subcortical and periventricular T2 hyperintensities bilaterally. EXAM: CT ANGIOGRAPHY HEAD AND NECK TECHNIQUE: Multidetector CT imaging of the head and neck was performed using the standard protocol during bolus administration of intravenous contrast. Multiplanar CT image reconstructions and MIPs were obtained to evaluate the vascular anatomy. Carotid stenosis measurements (when applicable) are obtained utilizing NASCET criteria, using the distal internal carotid diameter as the denominator. CONTRAST:  75mL OMNIPAQUE IOHEXOL 350 MG/ML SOLN COMPARISON:  MRI brain 10/06/2018. CT head without contrast 10/06/2018 FINDINGS: CTA NECK FINDINGS Aortic arch: A 3 vessel arch configuration is present. No significant atherosclerotic changes present at the arch or great vessel origins. There is no aneurysm or focal stenosis. Right carotid system: Right common carotid artery is within normal limits. Minimal atherosclerotic calcifications are present at the right carotid bifurcation. There is no significant stenosis. The cervical right ICA is otherwise normal Left carotid system: The left common carotid artery is within normal limits. Bifurcation is unremarkable. Cervical left ICA is normal. Vertebral arteries: Vertebral arteries originate from the subclavian arteries bilaterally. The right vertebral artery is the dominant vessel. There is a 50% stenosis at the origin of the left vertebral artery. No tandem stenoses are present in either vertebral artery in the neck. Skeleton: Prominent disc osteophyte complex  is present C6-7 and C7-T1. There is likely central canal stenosis at the C6-7 level. Vertebral body heights are maintained. There is reversal of the normal cervical lordosis. No focal lytic or blastic lesions are present. Other neck: No focal mucosal or submucosal lesions are present. Salivary glands are within normal limits bilaterally. Upper chest: Lung apices are clear.  Thoracic inlet is normal. Review of the MIP images confirms the above findings CTA HEAD FINDINGS Anterior circulation: The internal carotid arteries are within normal limits from the skull base through the ICA termini bilaterally. A1 and M1 segments are normal. The anterior communicating artery is patent. MCA bifurcations are intact. The ACA and MCA branch vessels are normal. Posterior circulation: Vertebral arteries are codominant at the skull base. PICA origins are visualized and normal. The vertebrobasilar junction is normal. Both posterior cerebral arteries originate from basilar tip. PCA branch vessels are normal. Venous sinuses: The dural sinuses are patent. Left transverse sinus is dominant. Straight sinus deep cerebral veins are intact. Cortical veins are unremarkable. Anatomic variants: None Review of the MIP images confirms the above findings IMPRESSION: 1. Normal CTA circle-of-Willis. No acute or focal vascular lesion to explain the patient's dizziness, nausea, or abnormal MRI findings. 2. Minimal atherosclerotic change at the right carotid bifurcation without significant stenosis. 3. Mild narrowing of the  left vertebral artery origin without a significant stenosis relative to the more distal vessel. 4. Cervical disc disease is most evident at C6-7 where a prominent disc osteophyte complex results in central canal narrowing. Electronically Signed   By: Marin Roberts M.D.   On: 10/06/2018 07:15   Ct Head Wo Contrast  Result Date: 10/06/2018 CLINICAL DATA:  48 year old male with acute dizziness. EXAM: CT HEAD WITHOUT CONTRAST  TECHNIQUE: Contiguous axial images were obtained from the base of the skull through the vertex without intravenous contrast. COMPARISON:  None. FINDINGS: Brain: No midline shift, mass effect, or evidence of intracranial mass lesion. No ventriculomegaly. Gray-white matter differentiation is within normal limits throughout the brain. No cortically based acute infarct identified. No acute intracranial hemorrhage identified. Vascular: No suspicious intracranial vascular hyperdensity. Skull: Partially visible small chronic left lamina papyracea fracture. No acute osseous abnormality identified. Sinuses/Orbits: Visualized paranasal sinuses and mastoids are well pneumatized. Other: No acute orbit or scalp soft tissue finding. IMPRESSION: Negative noncontrast CT appearance of the brain. Electronically Signed   By: Odessa Fleming M.D.   On: 10/06/2018 03:10   Ct Angio Neck W And/or Wo Contrast  Result Date: 10/06/2018 CLINICAL DATA:  Dizziness and nausea for 1 week. Vertigo, persistent, central. Abnormal MRI with diffuse subcortical and periventricular T2 hyperintensities bilaterally. EXAM: CT ANGIOGRAPHY HEAD AND NECK TECHNIQUE: Multidetector CT imaging of the head and neck was performed using the standard protocol during bolus administration of intravenous contrast. Multiplanar CT image reconstructions and MIPs were obtained to evaluate the vascular anatomy. Carotid stenosis measurements (when applicable) are obtained utilizing NASCET criteria, using the distal internal carotid diameter as the denominator. CONTRAST:  75mL OMNIPAQUE IOHEXOL 350 MG/ML SOLN COMPARISON:  MRI brain 10/06/2018. CT head without contrast 10/06/2018 FINDINGS: CTA NECK FINDINGS Aortic arch: A 3 vessel arch configuration is present. No significant atherosclerotic changes present at the arch or great vessel origins. There is no aneurysm or focal stenosis. Right carotid system: Right common carotid artery is within normal limits. Minimal atherosclerotic  calcifications are present at the right carotid bifurcation. There is no significant stenosis. The cervical right ICA is otherwise normal Left carotid system: The left common carotid artery is within normal limits. Bifurcation is unremarkable. Cervical left ICA is normal. Vertebral arteries: Vertebral arteries originate from the subclavian arteries bilaterally. The right vertebral artery is the dominant vessel. There is a 50% stenosis at the origin of the left vertebral artery. No tandem stenoses are present in either vertebral artery in the neck. Skeleton: Prominent disc osteophyte complex is present C6-7 and C7-T1. There is likely central canal stenosis at the C6-7 level. Vertebral body heights are maintained. There is reversal of the normal cervical lordosis. No focal lytic or blastic lesions are present. Other neck: No focal mucosal or submucosal lesions are present. Salivary glands are within normal limits bilaterally. Upper chest: Lung apices are clear.  Thoracic inlet is normal. Review of the MIP images confirms the above findings CTA HEAD FINDINGS Anterior circulation: The internal carotid arteries are within normal limits from the skull base through the ICA termini bilaterally. A1 and M1 segments are normal. The anterior communicating artery is patent. MCA bifurcations are intact. The ACA and MCA branch vessels are normal. Posterior circulation: Vertebral arteries are codominant at the skull base. PICA origins are visualized and normal. The vertebrobasilar junction is normal. Both posterior cerebral arteries originate from basilar tip. PCA branch vessels are normal. Venous sinuses: The dural sinuses are patent. Left transverse sinus is dominant.  Straight sinus deep cerebral veins are intact. Cortical veins are unremarkable. Anatomic variants: None Review of the MIP images confirms the above findings IMPRESSION: 1. Normal CTA circle-of-Willis. No acute or focal vascular lesion to explain the patient's  dizziness, nausea, or abnormal MRI findings. 2. Minimal atherosclerotic change at the right carotid bifurcation without significant stenosis. 3. Mild narrowing of the left vertebral artery origin without a significant stenosis relative to the more distal vessel. 4. Cervical disc disease is most evident at C6-7 where a prominent disc osteophyte complex results in central canal narrowing. Electronically Signed   By: Marin Robertshristopher  Mattern M.D.   On: 10/06/2018 07:15   Mr Brain Wo Contrast  Result Date: 10/06/2018 CLINICAL DATA:  Progressive vertigo over the last week. EXAM: MRI HEAD WITHOUT CONTRAST TECHNIQUE: Multiplanar, multiecho pulse sequences of the brain and surrounding structures were obtained without intravenous contrast. COMPARISON:  CT head without contrast 10/06/2018 FINDINGS: Brain: Diffuse subcortical T2 hyperintensities are moderately advanced for age. Periventricular T2 hyperintensity is most prominent about the atria of the lateral ventricles. No acute infarct, hemorrhage, or mass lesion is present. The ventricles are of normal size. No significant extraaxial fluid collection is present. The internal auditory canals are within normal limits. The brainstem and cerebellum are within normal limits. Vascular: Flow is present in the major intracranial arteries. Skull and upper cervical spine: The craniocervical junction is normal. Upper cervical spine is within normal limits. Marrow signal is unremarkable. Sinuses/Orbits: The paranasal sinuses and mastoid air cells are clear. A remote left orbital blowout fracture is present. Globes and orbits are otherwise within normal limits. IMPRESSION: 1. Diffuse subcortical T2 hyperintensities are moderately advanced for age. The finding is nonspecific but can be seen in the setting of chronic microvascular ischemia, a demyelinating process such as multiple sclerosis, vasculitis, complicated migraine headaches, or as the sequelae of a prior infectious or  inflammatory process. 2. Periventricular T2 hyperintensities focused about the atria of the lateral ventricles. This raises additional concern for a demyelinating process. 3. No acute intracranial abnormality.  No acute infarct. 4. Remote left medial orbital blowout fracture. Electronically Signed   By: Marin Robertshristopher  Mattern M.D.   On: 10/06/2018 05:14     Assessment/Plan:  48 y.o. male with hx of HTN does not have a PCP comes in because of dizziness with associated with nausea.  Patient feels dizzy only when he moves the head. S/P eval and Eply's maneuver.  He was seen ambulating today.   - Symptoms improved - MRI brain no acute abnormalities with white matter changes consistent with chronic HTN. Don't think this is demyelinating associated  - Meclizine/Valium - d/c planning   10/07/2018, 10:44 AM

## 2020-01-18 IMAGING — CT CT ANGIOGRAPHY NECK
2 of 7 series · 8 of 33 positions shown · IV contrast (APPLIED)
Comparison: MRI brain 10/06/2018. CT head without contrast
10/06/2018

CLINICAL DATA: Dizziness and nausea for 1 week. Vertigo,
persistent, central. Abnormal MRI with diffuse subcortical and
periventricular T2 hyperintensities bilaterally.

EXAM:
CT ANGIOGRAPHY HEAD AND NECK
TECHNIQUE: Multidetector CT imaging of the head and neck was performed using
the standard protocol during bolus administration of intravenous
contrast. Multiplanar CT image reconstructions and MIPs were
obtained to evaluate the vascular anatomy. Carotid stenosis
measurements (when applicable) are obtained utilizing NASCET
criteria, using the distal internal carotid diameter as the
denominator.
CONTRAST:  75mL OMNIPAQUE IOHEXOL 350 MG/ML SOLN

[Series 4: cta head neck · axial · 0.60mm/px · z∈[-149,-35]mm · 2 of 171 slices shown]
[im 57/171  soft-tissue]
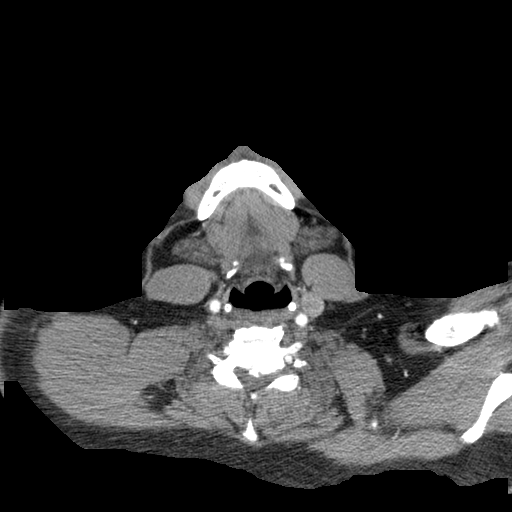
[im 114/171  bone]
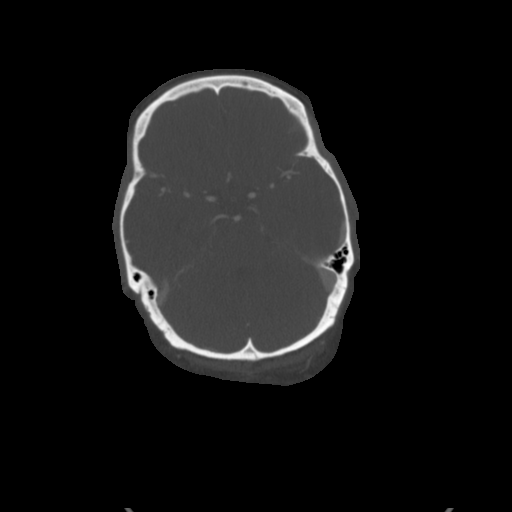

[Series 6: ax thin · axial · 0.59mm/px · z∈[-215,+20]mm · 6 of 330 slices shown]
[im 48/330  soft-tissue]
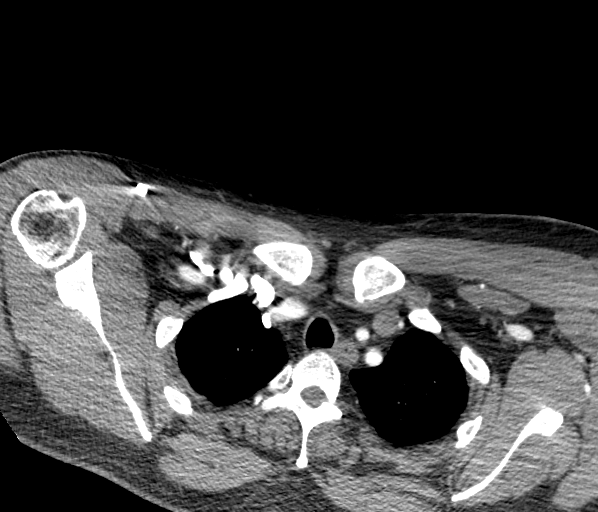
[im 95/330  soft-tissue]
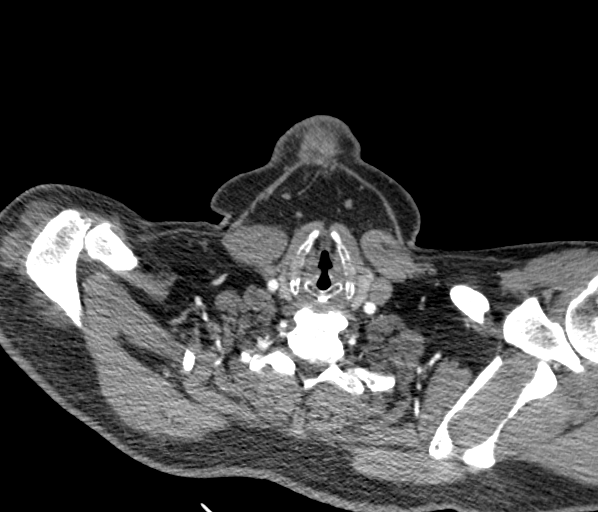
[im 142/330  soft-tissue]
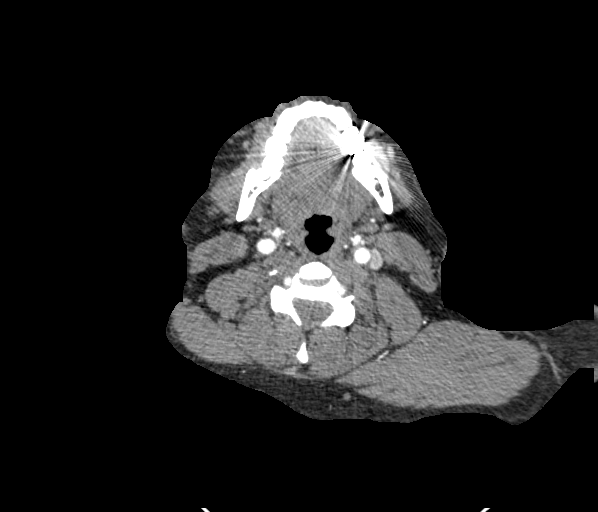
[im 189/330  soft-tissue]
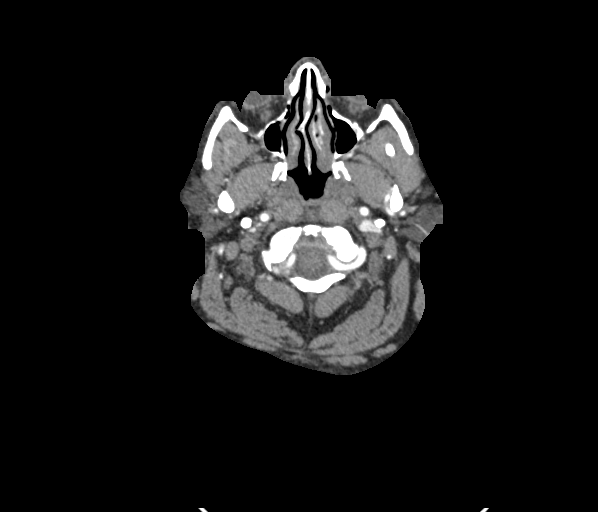
[im 236/330  soft-tissue]
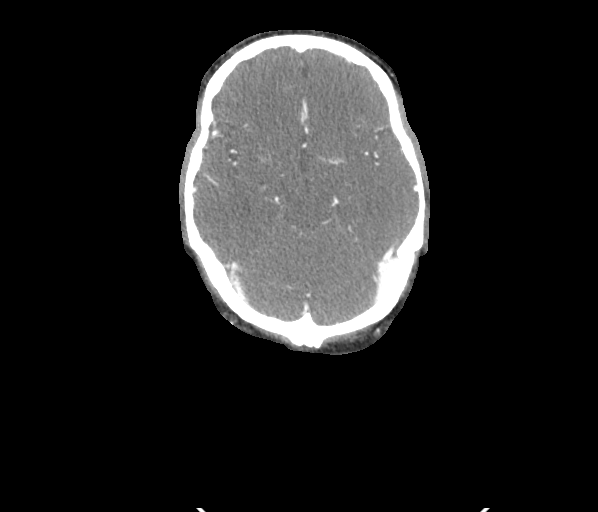
[im 283/330  soft-tissue]
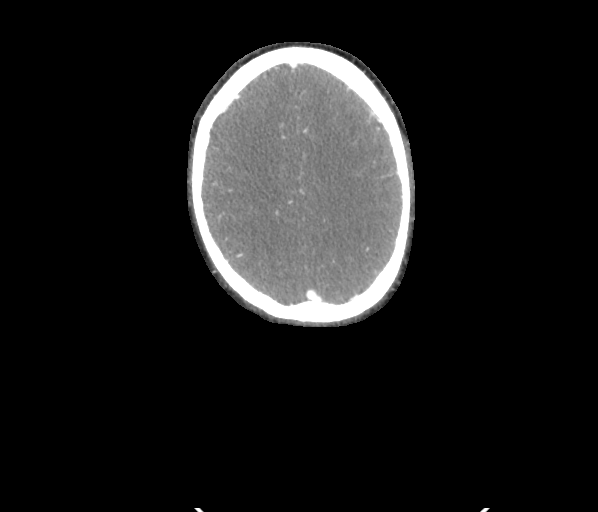

[8 of 33 positions shown; findings below may reference images not displayed]

FINDINGS: CTA NECK FINDINGS

Aortic arch: A 3 vessel arch configuration is present. No
significant atherosclerotic changes present at the arch or great
vessel origins. There is no aneurysm or focal stenosis.

Right carotid system: Right common carotid artery is within normal
limits. Minimal atherosclerotic calcifications are present at the
right carotid bifurcation. There is no significant stenosis. The
cervical right ICA is otherwise normal

Left carotid system: The left common carotid artery is within normal
limits. Bifurcation is unremarkable. Cervical left ICA is normal.

Vertebral arteries: Vertebral arteries originate from the subclavian
arteries bilaterally. The right vertebral artery is the dominant
vessel. There is a 50% stenosis at the origin of the left vertebral
artery. No tandem stenoses are present in either vertebral artery in
the neck.

Skeleton: Prominent disc osteophyte complex is present C6-7 and
C7-T1. There is likely central canal stenosis at the C6-7 level.
Vertebral body heights are maintained. There is reversal of the
normal cervical lordosis. No focal lytic or blastic lesions are
present.

Other neck: No focal mucosal or submucosal lesions are present.
Salivary glands are within normal limits bilaterally.

Upper chest: Lung apices are clear.  Thoracic inlet is normal.

Review of the MIP images confirms the above findings

CTA HEAD FINDINGS

Anterior circulation: The internal carotid arteries are within
normal limits from the skull base through the ICA termini
bilaterally. A1 and M1 segments are normal. The anterior
communicating artery is patent. MCA bifurcations are intact. The ACA
and MCA branch vessels are normal.

Posterior circulation: Vertebral arteries are codominant at the
skull base. PICA origins are visualized and normal. The
vertebrobasilar junction is normal. Both posterior cerebral arteries
originate from basilar tip. PCA branch vessels are normal.

Venous sinuses: The dural sinuses are patent. Left transverse sinus
is dominant. Straight sinus deep cerebral veins are intact. Cortical
veins are unremarkable.

Anatomic variants: None

Review of the MIP images confirms the above findings
IMPRESSION: 1. Normal CTA circle-of-Willis. No acute or focal vascular lesion to
explain the patient's dizziness, nausea, or abnormal MRI findings.
2. Minimal atherosclerotic change at the right carotid bifurcation
without significant stenosis.
3. Mild narrowing of the left vertebral artery origin without a
significant stenosis relative to the more distal vessel.
4. Cervical disc disease is most evident at C6-7 where a prominent
disc osteophyte complex results in central canal narrowing.

## 2020-01-18 IMAGING — MR MRI HEAD WITHOUT CONTRAST
9 of 10 series · 41 of 48 positions shown · non-contrast
Comparison: CT head without contrast 10/06/2018

CLINICAL DATA: Progressive vertigo over the last week.

EXAM:
MRI HEAD WITHOUT CONTRAST
TECHNIQUE: Multiplanar, multiecho pulse sequences of the brain and surrounding
structures were obtained without intravenous contrast.

[Series 2: ax dwi_tracew · axial · 3.0mm · 0.83mm/px · z∈[-80,+82]mm · 6 of 55 slices shown]
[im 1/55]
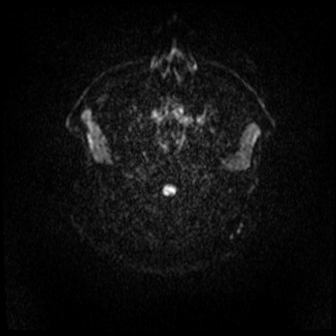
[im 11/55]
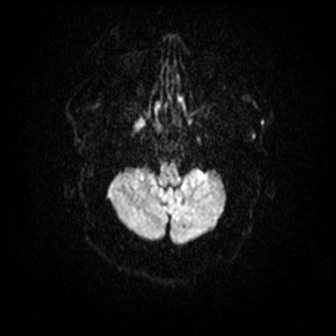
[im 22/55]
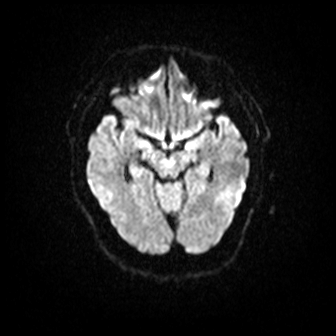
[im 33/55]
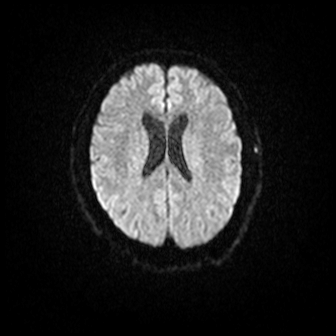
[im 44/55]
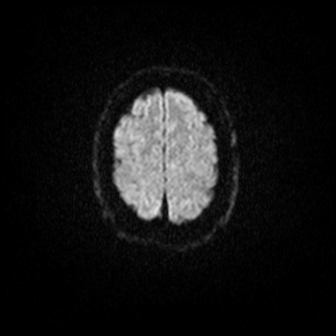
[im 55/55]
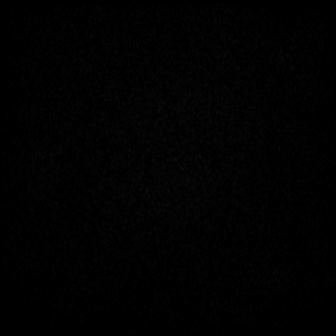

[Series 3: ax dwi_adc · axial · 3.0mm · 0.83mm/px · z∈[-80,+79]mm · 7 of 54 slices shown]
[im 1/54]
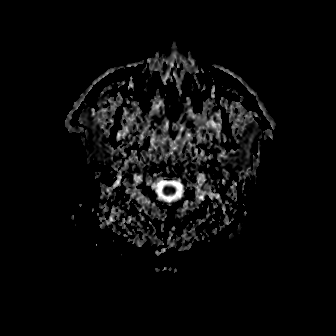
[im 9/54]
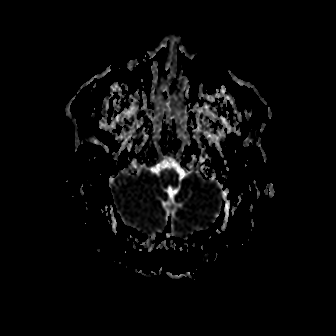
[im 18/54]
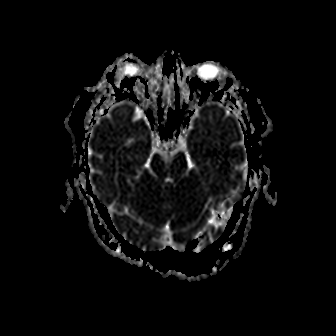
[im 27/54]
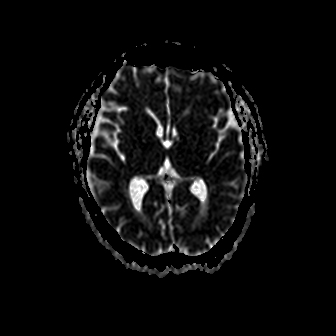
[im 36/54]
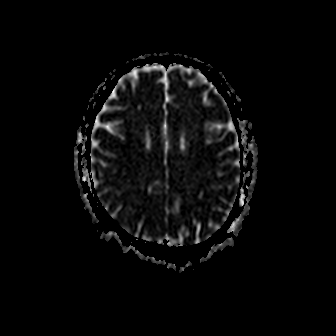
[im 45/54]
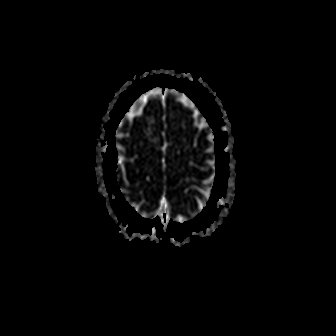
[im 54/54]
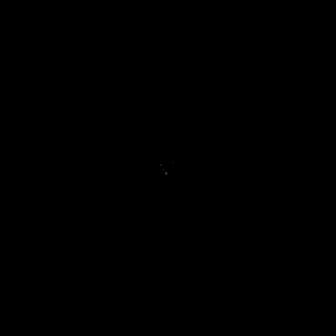

[Series 4: cor dwi_tracew · coronal · 5.0mm · 0.68mm/px · 6 of 45 slices shown]
[im 1/45]
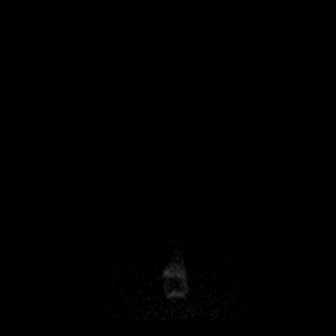
[im 9/45]
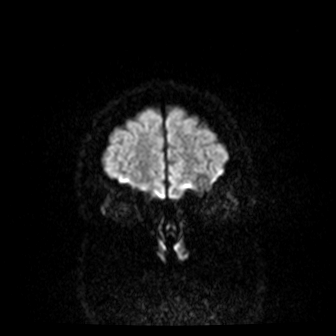
[im 18/45]
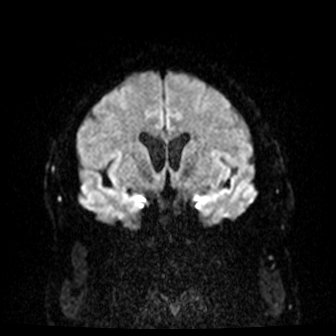
[im 27/45]
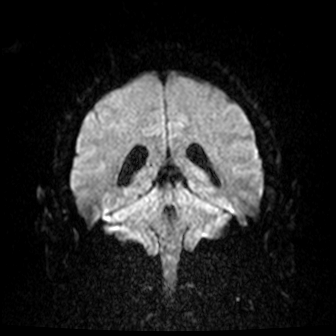
[im 36/45]
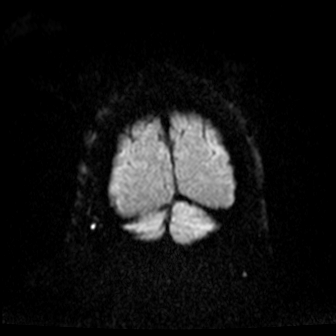
[im 45/45]
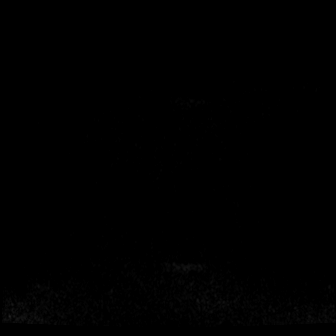

[Series 5: cor dwi_adc · coronal · 5.0mm · 0.68mm/px · 3 of 44 slices shown]
[im 1/44]
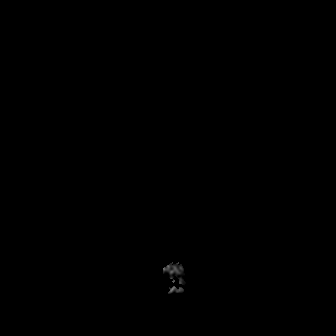
[im 9/44]
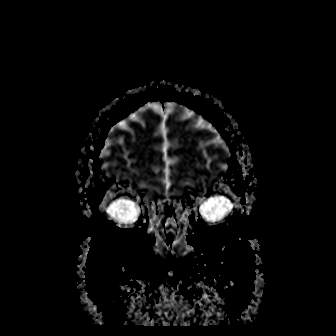
[im 18/44]
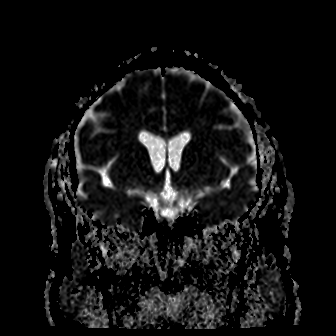

[Series 6: T1 · sagittal · 5.0mm · 0.94mm/px · 3 of 25 slices shown (1 of 2)]
[im 1/25]
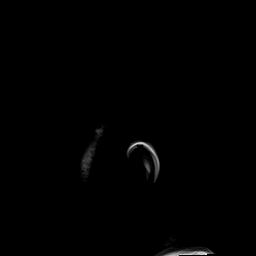
[im 13/25]
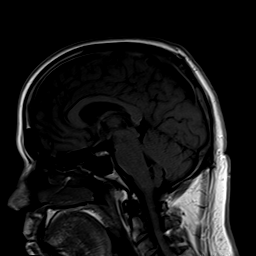
[im 25/25]
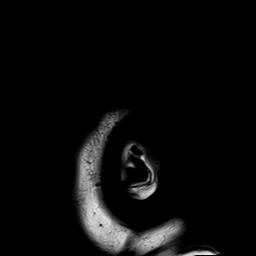

[Series 7: T2 · axial · 5.0mm · 0.45mm/px · z∈[-76,+80]mm · 4 of 27 slices shown (1 of 2)]
[im 1/27]
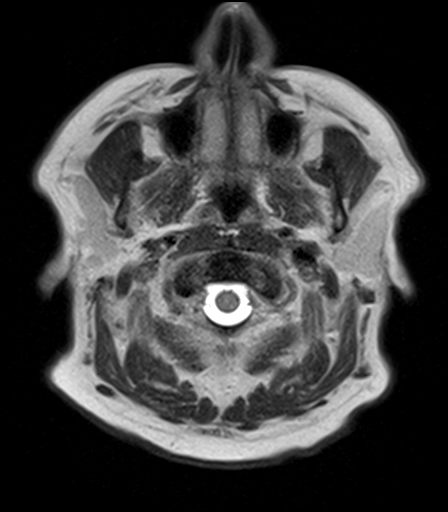
[im 9/27]
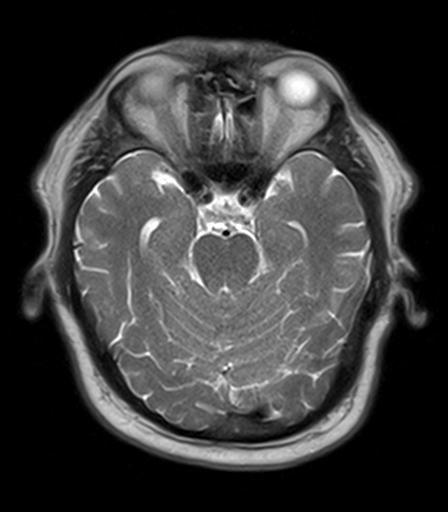
[im 18/27]
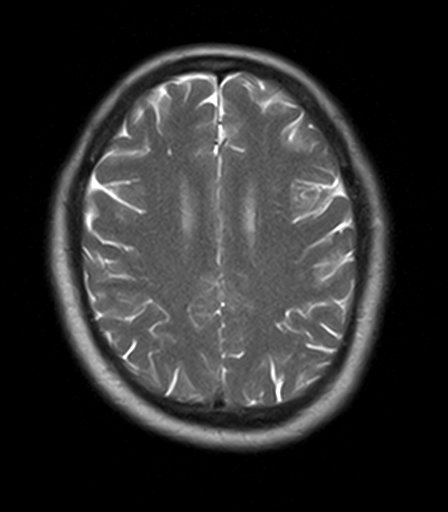
[im 27/27]
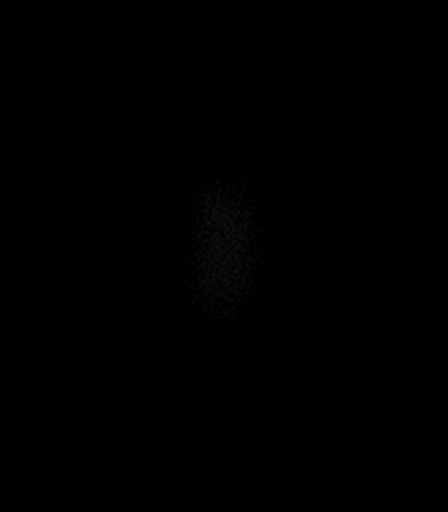

[Series 9: FLAIR · axial · 5.0mm · 1.20mm/px · z∈[-76,+80]mm · 4 of 27 slices shown]
[im 1/27]
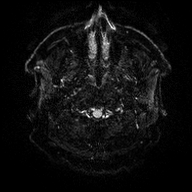
[im 9/27]
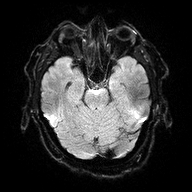
[im 18/27]
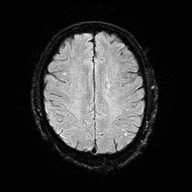
[im 27/27]
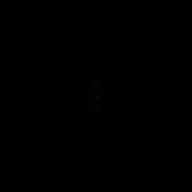

[Series 10: T1 · axial · 5.0mm · 0.90mm/px · z∈[-76,+80]mm · 4 of 27 slices shown (2 of 2)]
[im 1/27]
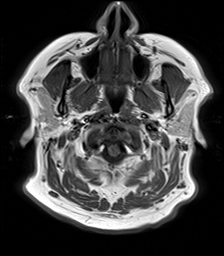
[im 9/27]
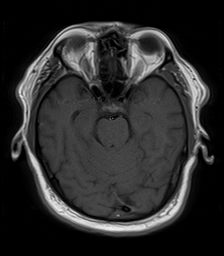
[im 18/27]
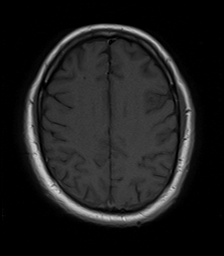
[im 27/27]
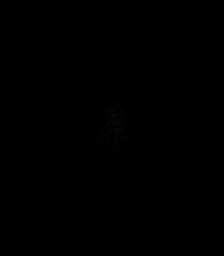

[Series 11: T2 · coronal · 5.0mm · 0.45mm/px · 4 of 31 slices shown (2 of 2)]
[im 1/31]
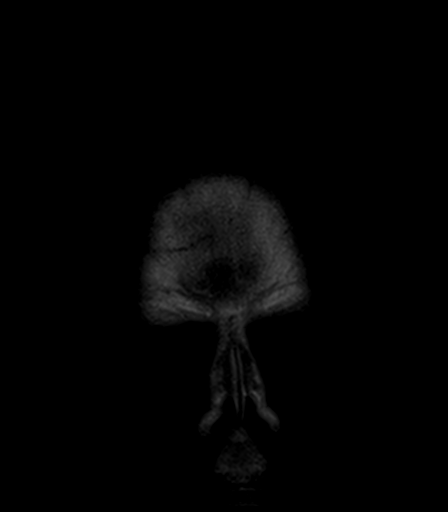
[im 11/31]
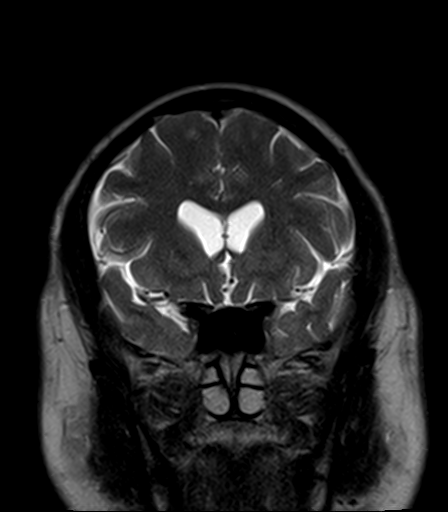
[im 21/31]
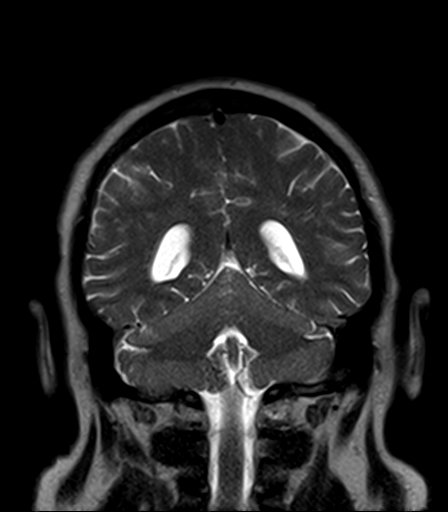
[im 31/31]
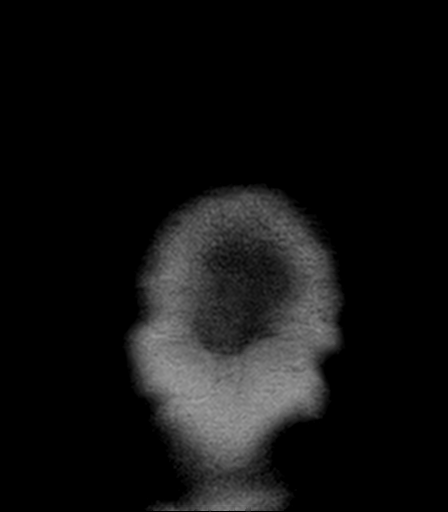

[41 of 48 positions shown; findings below may reference images not displayed]

FINDINGS: Brain: Diffuse subcortical T2 hyperintensities are moderately
advanced for age. Periventricular T2 hyperintensity is most
prominent about the atria of the lateral ventricles.

No acute infarct, hemorrhage, or mass lesion is present. The
ventricles are of normal size. No significant extraaxial fluid
collection is present. The internal auditory canals are within
normal limits. The brainstem and cerebellum are within normal
limits.

Vascular: Flow is present in the major intracranial arteries.

Skull and upper cervical spine: The craniocervical junction is
normal. Upper cervical spine is within normal limits. Marrow signal
is unremarkable.

Sinuses/Orbits: The paranasal sinuses and mastoid air cells are
clear. A remote left orbital blowout fracture is present. Globes and
orbits are otherwise within normal limits.
IMPRESSION: 1. Diffuse subcortical T2 hyperintensities are moderately advanced
for age. The finding is nonspecific but can be seen in the setting
of chronic microvascular ischemia, a demyelinating process such as
multiple sclerosis, vasculitis, complicated migraine headaches, or
as the sequelae of a prior infectious or inflammatory process.
2. Periventricular T2 hyperintensities focused about the atria of
the lateral ventricles. This raises additional concern for a
demyelinating process.
3. No acute intracranial abnormality.  No acute infarct.
4. Remote left medial orbital blowout fracture.

## 2020-11-18 ENCOUNTER — Emergency Department (HOSPITAL_COMMUNITY): Admission: EM | Admit: 2020-11-18 | Discharge: 2020-11-18 | Discharge status: Against Medical Advice | Payer: Self-pay

## 2020-11-18 NOTE — ED Notes (Signed)
Patient stated that he could not wait. I informed him, that we would see him. He stated that he understood, and know that we were busy, and would come back later.

## 2023-02-09 ENCOUNTER — Emergency Department: Payer: Self-pay

## 2023-02-09 ENCOUNTER — Emergency Department
Admission: EM | Admit: 2023-02-09 | Discharge: 2023-02-10 | Disposition: A | Discharge status: Other Acute Inpatient Hospital | Payer: Self-pay | Attending: Emergency Medicine | Admitting: Emergency Medicine

## 2023-02-09 ENCOUNTER — Other Ambulatory Visit: Payer: Self-pay

## 2023-02-09 ENCOUNTER — Encounter (HOSPITAL_COMMUNITY): Payer: Self-pay

## 2023-02-09 DIAGNOSIS — R55 Syncope and collapse: Secondary | ICD-10-CM

## 2023-02-09 DIAGNOSIS — I442 Atrioventricular block, complete: Secondary | ICD-10-CM | POA: Insufficient documentation

## 2023-02-09 LAB — CBC
HCT: 40.9 % (ref 39.0–52.0)
Hemoglobin: 14 g/dL (ref 13.0–17.0)
MCH: 31.3 pg (ref 26.0–34.0)
MCHC: 34.2 g/dL (ref 30.0–36.0)
MCV: 91.5 fL (ref 80.0–100.0)
Platelets: 293 10*3/uL (ref 150–400)
RBC: 4.47 MIL/uL (ref 4.22–5.81)
RDW: 12 % (ref 11.5–15.5)
WBC: 11.1 10*3/uL — ABNORMAL HIGH (ref 4.0–10.5)
nRBC: 0 % (ref 0.0–0.2)

## 2023-02-09 LAB — URINALYSIS, ROUTINE W REFLEX MICROSCOPIC
Bilirubin Urine: NEGATIVE
Glucose, UA: NEGATIVE mg/dL
Hgb urine dipstick: NEGATIVE
Ketones, ur: NEGATIVE mg/dL
Leukocytes,Ua: NEGATIVE
Nitrite: NEGATIVE
Protein, ur: NEGATIVE mg/dL
Specific Gravity, Urine: 1.005 (ref 1.005–1.030)
pH: 6 (ref 5.0–8.0)

## 2023-02-09 LAB — TSH: TSH: 1.884 u[IU]/mL (ref 0.350–4.500)

## 2023-02-09 LAB — MAGNESIUM: Magnesium: 2.2 mg/dL (ref 1.7–2.4)

## 2023-02-09 LAB — BASIC METABOLIC PANEL
Anion gap: 12 (ref 5–15)
BUN: 16 mg/dL (ref 6–20)
CO2: 23 mmol/L (ref 22–32)
Calcium: 9.4 mg/dL (ref 8.9–10.3)
Chloride: 103 mmol/L (ref 98–111)
Creatinine, Ser: 1.01 mg/dL (ref 0.61–1.24)
GFR, Estimated: >60 mL/min (ref >60)
Glucose, Bld: 114 mg/dL — ABNORMAL HIGH (ref 70–99)
Potassium: 3.6 mmol/L (ref 3.5–5.1)
Sodium: 138 mmol/L (ref 135–145)

## 2023-02-09 LAB — T4, FREE: Free T4: 1 ng/dL (ref 0.61–1.12)

## 2023-02-09 LAB — TROPONIN I (HIGH SENSITIVITY): Troponin I (High Sensitivity): 9 ng/L (ref <18)

## 2023-02-09 NOTE — ED Provider Notes (Signed)
[facility] Provider Note    Event Date/Time   First MD Initiated Contact with Patient 02/09/23 1459     (approximate)   History   Chief Complaint Dizziness   HPI  Jaime Moore is a 52 y.o. male with no significant past medical history who presents to the ED complaining of dizziness.  Patient reports that he has been feeling intermittently dizzy and lightheaded for the past 3 days.  He states he feels like he is going to pass out at times, but denies anything in particular exacerbating his symptoms.  He reports some tightness in his chest and mild difficulty breathing, but has not had any fever or cough.  He does not take any medications regularly and denies alcohol or drug use.  He states he has not seen a doctor in multiple years, denies any medical history and does not take any medicines regularly.     Physical Exam   Triage Vital Signs: ED Triage Vitals  Encounter Vitals Group     BP 02/09/23 1441 (!) 157/61     Systolic BP Percentile --      Diastolic BP Percentile --      Pulse Rate 02/09/23 1431 (!) 41     Resp 02/09/23 1434 18     Temp 02/09/23 1431 98.2 F (36.8 C)     Temp Source 02/09/23 1431 Oral     SpO2 02/09/23 1431 97 %     Weight 02/09/23 1432 201 lb 8 oz (91.4 kg)     Height 02/09/23 1432 5\' 7"  (1.702 m)     Head Circumference --      Peak Flow --      Pain Score 02/09/23 1432 0     Pain Loc --      Pain Education --      Exclude from Growth Chart --     Most recent vital signs: Vitals:   02/09/23 1434 02/09/23 1441  BP:  (!) 157/61  Pulse:    Resp: 18   Temp:    SpO2:      Constitutional: Alert and oriented. Eyes: Conjunctivae are normal. Head: Atraumatic. Nose: No congestion/rhinnorhea. Mouth/Throat: Mucous membranes are moist.  Cardiovascular: Bradycardic, regular rhythm. Grossly normal heart sounds.  2+ radial pulses bilaterally. Respiratory: Normal respiratory effort.  No retractions. Lungs  CTAB. Gastrointestinal: Soft and nontender. No distention. Musculoskeletal: No lower extremity tenderness nor edema.  Neurologic:  Normal speech and language. No gross focal neurologic deficits are appreciated.    ED Results / Procedures / Treatments   Labs (all labs ordered are listed, but only abnormal results are displayed) Labs Reviewed  BASIC METABOLIC PANEL - Abnormal; Notable for the following components:      Result Value   Glucose, Bld 114 (*)    All other components within normal limits  CBC - Abnormal; Notable for the following components:   WBC 11.1 (*)    All other components within normal limits  URINALYSIS, ROUTINE W REFLEX MICROSCOPIC - Abnormal; Notable for the following components:   Color, Urine STRAW (*)    APPearance CLEAR (*)    All other components within normal limits  MAGNESIUM  TSH  T4, FREE  CBG MONITORING, ED  TROPONIN I (HIGH SENSITIVITY)     EKG  ED ECG REPORT I, Chesley Noon, the attending physician, personally viewed and interpreted this ECG.   Date: 02/09/2023  EKG Time: 14:44  Rate: 40  Rhythm: Idioventricular rhythm  Axis: Normal  Intervals:complete heart block  ST&T Change: None  RADIOLOGY Chest x-ray reviewed and interpreted by me with no infiltrate, edema, or effusion.  PROCEDURES:  Critical Care performed: Yes, see critical care procedure note(s)  .Critical Care  Performed by: Chesley Noon, MD Authorized by: Chesley Noon, MD   Critical care provider statement:    Critical care time (minutes):  30   Critical care time was exclusive of:  Separately billable procedures and treating other patients and teaching time   Critical care was necessary to treat or prevent imminent or life-threatening deterioration of the following conditions:  Cardiac failure (Complete heart block)   Critical care was time spent personally by me on the following activities:  Development of treatment plan with patient or surrogate,  discussions with consultants, evaluation of patient's response to treatment, examination of patient, ordering and review of laboratory studies, ordering and review of radiographic studies, ordering and performing treatments and interventions, pulse oximetry, re-evaluation of patient's condition and review of old charts   I assumed direction of critical care for this patient from another provider in my specialty: no     Care discussed with: accepting provider at another facility      MEDICATIONS ORDERED IN ED: Medications - No data to display   IMPRESSION / MDM / ASSESSMENT AND PLAN / ED COURSE  I reviewed the triage vital signs and the nursing notes.                              52 y.o. male with no significant past medical history who presents to the ED complaining of intermittent dizziness, lightheadedness, and near syncope for the past 3 days.  Patient's presentation is most consistent with acute presentation with potential threat to life or bodily function.  Differential diagnosis includes, but is not limited to, arrhythmia, ACS, vasovagal syncope, dehydration, orthostatic hypotension, anemia, electrolyte abnormality, AKI.  Patient nontoxic-appearing and in no acute distress, vital signs remarkable for bradycardia with heart rate in the low 40s consistently.  EKG appears concerning for complete heart block with no ischemic changes.  Labs show no significant anemia, leukocytosis, lecture abnormality, or AKI.  Thyroid function is also unremarkable, magnesium level within normal limits, and troponin also reassuring.  Case discussed with Dr. Flora Lipps of cardiology, who recommends admission for nonemergent pacemaker placement.  Unfortunately, we will not have anyone available for pacemaker placement at [facility] in the next 24 hours and so Dr. Flora Lipps recommends transfer to [facility].  I spoke with Dr. Elease Hashimoto of cardiology at [facility], who accepted patient for transfer to the cardiology service.   Patient in agreement with transfer, remains hemodynamically stable at this time.      FINAL CLINICAL IMPRESSION(S) / ED DIAGNOSES   Final diagnoses:  Complete heart block (HCC)  Near syncope     Rx / DC Orders   ED Discharge Orders     None        Note:  This document was prepared using Dragon voice recognition software and may include unintentional dictation errors.   Chesley Noon, MD 02/09/23 [phone]

## 2023-02-09 NOTE — Plan of Care (Signed)
Called by emergency room physician, Dr. Larinda Buttery, about patient in complete heart block.  EKG does confirm complete heart block with stable escape rhythm in the 40s.  The patient's blood pressure is stable.  Discussed case briefly with the EP.  Plan is for transfer to [facility] for likely pacemaker in the morning.  Emergency room team will arrange transfer to [facility] with cardiology on-call at [facility].  Gerri Spore T. Flora Lipps, MD, [facility]  [facility]  [address] [place], [place] [address] [phone]  3:39 PM

## 2023-02-09 NOTE — ED Notes (Signed)
called to [facility] @ 10:00 for pt update/still no bed assignment /rep:Charlie

## 2023-02-09 NOTE — ED Triage Notes (Addendum)
Pt here with dizziness x3 days. Pt states he was here a few years ago for the same complaint and it was related to his blood pressure. Pt does not take bp meds currently. Pt denies cp. Pt denies NV but some diarrhea. Pt states he feels weaker than normal. Pt also states he is currently dizzy.

## 2023-02-10 ENCOUNTER — Emergency Department: Admit: 2023-02-10 | Payer: Self-pay

## 2023-02-10 ENCOUNTER — Emergency Department (EMERGENCY_DEPARTMENT_HOSPITAL): Payer: Self-pay

## 2023-02-10 ENCOUNTER — Inpatient Hospital Stay (HOSPITAL_COMMUNITY)
Admission: RE | Admit: 2023-02-10 | Discharge: 2023-02-12 | DRG: 244 | Disposition: A | Discharge status: Home / Self Care | Payer: Self-pay | Source: Other Acute Inpatient Hospital | Attending: Cardiology | Admitting: Cardiology

## 2023-02-10 DIAGNOSIS — I442 Atrioventricular block, complete: Principal | ICD-10-CM | POA: Diagnosis present

## 2023-02-10 DIAGNOSIS — I10 Essential (primary) hypertension: Secondary | ICD-10-CM | POA: Diagnosis present

## 2023-02-10 LAB — HIV ANTIBODY (ROUTINE TESTING W REFLEX): HIV Screen 4th Generation wRfx: NONREACTIVE

## 2023-02-10 MED ORDER — CEFAZOLIN SODIUM-DEXTROSE 2-4 GM/100ML-% IV SOLN
2.0000 g | INTRAVENOUS | Status: AC
  Administered 2023-02-11: 2 g via INTRAVENOUS

## 2023-02-10 MED ORDER — ACETAMINOPHEN 325 MG PO TABS
650.0000 mg | ORAL_TABLET | ORAL | Status: DC | PRN
  Administered 2023-02-12: 650 mg via ORAL
  Filled 2023-02-10: qty 2

## 2023-02-10 MED ORDER — SODIUM CHLORIDE 0.9 % IV SOLN: INTRAVENOUS | Status: DC

## 2023-02-10 MED ORDER — CHLORHEXIDINE GLUCONATE 4 % EX SOLN
60.0000 mL | Freq: Once | CUTANEOUS | Status: AC
  Administered 2023-02-10: 4 via TOPICAL
  Filled 2023-02-10: qty 60

## 2023-02-10 MED ORDER — SODIUM CHLORIDE 0.9 % IV SOLN: 80.0000 mg | INTRAVENOUS | Status: DC

## 2023-02-10 MED ORDER — CHLORHEXIDINE GLUCONATE 4 % EX SOLN
60.0000 mL | Freq: Once | CUTANEOUS | Status: AC
  Administered 2023-02-11: 4 via TOPICAL
  Filled 2023-02-10: qty 15

## 2023-02-10 MED ORDER — GADOBUTROL 1 MMOL/ML IV SOLN
13.0000 mL | Freq: Once | INTRAVENOUS | Status: AC | PRN
  Administered 2023-02-10: 13 mL via INTRAVENOUS

## 2023-02-10 MED ORDER — SODIUM CHLORIDE 0.9 % IV SOLN: 250.0000 mL | INTRAVENOUS | Status: DC

## 2023-02-10 MED ORDER — SODIUM CHLORIDE 0.9% FLUSH: 3.0000 mL | INTRAVENOUS | Status: DC | PRN

## 2023-02-10 MED ORDER — SODIUM CHLORIDE 0.9% FLUSH
3.0000 mL | Freq: Two times a day (BID) | INTRAVENOUS | Status: DC
  Administered 2023-02-10 – 2023-02-11 (×3): 3 mL via INTRAVENOUS

## 2023-02-10 NOTE — Progress Notes (Signed)
   ELECTROPHYSIOLOGY CONSULT NOTE    Patient ID: Jaime Moore MRN: [id number], DOB/AGE: February 01, 1971 52 y.o.  Admit date: 02/09/2023 Date of Consult: 02/10/2023  Primary Physician: Patient, No Pcp Per Primary Cardiologist: None  Electrophysiologist: none  Referring Provider: Dr. Lenard Lance  Patient Profile: Jaime Moore is a 52 y.o. male with a history of HTN who is being seen today for the evaluation of CHB at the request of Dr. Lenard Lance.  HPI:  Jaime Moore is a 52 y.o. male with PMH as above who presented to [facility] with 3 days of lightheadedness and dizziness. He has had these symptoms before transiently and thought his blood pressure was high. He does not have a PCP. No syncope. Did have some mild chest discomfort and has had increased indigestion and belching.  EKG showed CHB with ventricular escape in the 40s. He has been hemodynamically stable.  Labs relatively normal with K of 3.6, troponin 9.   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  He denies chest pain, palpitations, dyspnea, PND, orthopnea, nausea, vomiting, dizziness, syncope, edema, weight gain, or early satiety.   Labs Potassium3.6 (10/13 1436) Magnesium  2.2 (10/13 1436) Creatinine, ser  1.01 (10/13 1436) PLT  293 (10/13 1436) HGB  14.0 (10/13 1436) WBC 11.1* (10/13 1436) Troponin I (High Sensitivity)9 (10/13 1436).    No past medical history on file.   Surgical History: No past surgical history on file.   (Not in a hospital admission)   Inpatient Medications:   Allergies: No Known Allergies  No family history on file.   Physical Exam: Vitals:   02/10/23 0815 02/10/23 0830 02/10/23 0845 02/10/23 0900  BP: 123/61 (!) 143/66 137/64 (!) 128/58  Pulse: (!) 45 (!) 36 (!) 33 (!) 33  Resp: 20 14 10 11   Temp:      TempSrc:      SpO2: 97% 100% 100% 100%  Weight:      Height:        GEN- NAD, A&O x 3, normal affect HEENT: Normocephalic, atraumatic Lungs- CTAB, Normal effort.   Heart- irregular, bradycardic rate and rhythm, No M/G/R.  GI- obese, firm, NT, ND.  Extremities- No clubbing, cyanosis, or edema   Radiology/Studies: DG Chest Portable 1 View  Result Date: 02/09/2023 CLINICAL DATA:  Chest pain, dizziness EXAM: PORTABLE CHEST 1 VIEW COMPARISON:  06/07/2017 FINDINGS: Mild cardiomegaly. Both lungs are clear. The visualized skeletal structures are unremarkable. IMPRESSION: Mild cardiomegaly without acute abnormality of the lungs in AP portable projection Electronically Signed   By: Jearld Lesch M.D.   On: 02/09/2023 15:49    EKG:CHB, rate 41; wide escape (personally reviewed)  TELEMETRY: CHB with rates 30-50s (personally reviewed)   Assessment/Plan: #) CHB   Patient presents with 3 days of lightheadedness and dizziness, no syncope EKG with CHB Trops negative Patient was not taking any medications prior to admission Thyroid panel normal TTE pending cMRI pending Lyme titers pending  He will require transfer to [facility] for PPM placement, assuming above pending workup is normal.        For questions or updates, please contact [facility] Please consult [email or web address] for contact info under Cardiology/STEMI.  Signed, Sherie Don, NP  02/10/2023 9:14 AM

## 2023-02-10 NOTE — H&P (Shared)
Cardiology Admission History and Physical   Patient ID: Jaime Moore MRN: [id number]; DOB: Dec 18, 1970   Admission date: (Not on file) *** refresh  PCP:  Patient, No Pcp Per   [facility] Providers Cardiologist:  None   { Click here to update MD or APP on Care Team, Refresh:1}     Chief Complaint:  dizziness, lightheadedness  Patient Profile:   Jaime Moore is a 52 y.o. male with PMH of HTN who is being seen 02/10/2023 for the evaluation of CHB.  History of Present Illness:   Jaime Moore presented to [facility]13 with 3 days of lightheadedness and dizziness. He has had these symptoms before transiently and thought his blood pressure was high. He does not have a PCP, does not take medications for his HTN. No syncope. Did have some mild chest discomfort and has had increased indigestion and belching, denies lower extremity edema.  EKG showed CHB with ventricular escape in the 40s. He has been hemodynamically stable.  Labs relatively normal with K of 3.6, troponin 9. 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  No past surgical history on file.   Medications Prior to Admission: Prior to Admission medications   Medication Sig Start Date End Date Taking? Authorizing Provider  amLODipine (NORVASC) 10 MG tablet Take 1 tablet (10 mg total) by mouth daily for 30 days. 10/07/18 11/06/18  Katha Hamming, MD  meclizine (ANTIVERT) 12.5 MG tablet Take 1 tablet (12.5 mg total) by mouth 2 (two) times daily as needed for dizziness. 10/07/18   Katha Hamming, MD  simvastatin (ZOCOR) 10 MG tablet Take 1 tablet (10 mg total) by mouth daily at 6 PM. 10/07/18   Katha Hamming, MD     Allergies:   No Known Allergies  Social History:   Social History   Socioeconomic History   Marital status: Single    Spouse name: Not on file   Number of children: Not on file    Years of education: Not on file   Highest education level: Not on file  Occupational History   Not on file  Tobacco Use   Smoking status: Never   Smokeless tobacco: Never  Vaping Use   Vaping status: Never Used  Substance and Sexual Activity   Alcohol use: No   Drug use: No   Sexual activity: Not on file  Other Topics Concern   Not on file  Social History Narrative   Not on file   Social Determinants of Health   Financial Resource Strain: Not on file  Food Insecurity: Not on file  Transportation Needs: Not on file  Physical Activity: Not on file  Stress: Not on file  Social Connections: Not on file  Intimate Partner Violence: Not on file    Family History:  *** The patient's family history is not on file.    ROS:  Please see the history of present illness.  ***All other ROS reviewed and negative.     Physical Exam/Data:  There were no vitals filed for this visit.  Intake/Output Summary (Last 24 hours) at 02/10/2023 1134 Last data filed at 02/09/2023 1618 Gross per 24 hour  Intake --  Output 500 ml  Net -500 ml      02/09/2023    2:32 PM 10/06/2018   11:05 AM 10/05/2018   10:36 PM  Last 3 Weights  Weight (lbs) 201  lb 8 oz 201 lb 8 oz 205 lb  Weight (kg) 91.4 kg 91.4 kg 92.987 kg     There is no height or weight on file to calculate BMI.  General:  Well nourished, well developed, in no acute distress HEENT: normal Neck: no JVD Vascular: No carotid bruits; Distal pulses 2+ bilaterally   Cardiac:  Irregularly, irregular bradycardic rate; no murmur  Lungs:  clear to auscultation bilaterally, no wheezing, rhonchi or rales  Abd: obese, firm but compressible, non-tender, no hepatomegaly  Ext: no edema Musculoskeletal:  No deformities, BUE and BLE strength normal and equal Skin: warm and dry  Neuro:  CNs 2-12 intact, no focal abnormalities noted Psych:  Normal affect    EKG:  02/09/2023 - CHB, rate 41; wide escape    Relevant CV Studies: Cardiac MRI 10/14 -  results pending  Laboratory Data:  High Sensitivity Troponin:   Recent Labs  Lab 02/09/23 1436  TROPONINIHS 9      Chemistry Recent Labs  Lab 02/09/23 1436  NA 138  K 3.6  CL 103  CO2 23  GLUCOSE 114*  BUN 16  CREATININE 1.01  CALCIUM 9.4  MG 2.2  GFRNONAA >60  ANIONGAP 12    No results for input(s): "PROT", "ALBUMIN", "AST", "ALT", "ALKPHOS", "BILITOT" in the last 168 hours. Lipids No results for input(s): "CHOL", "TRIG", "HDL", "LABVLDL", "LDLCALC", "CHOLHDL" in the last 168 hours. Hematology Recent Labs  Lab 02/09/23 1436  WBC 11.1*  RBC 4.47  HGB 14.0  HCT 40.9  MCV 91.5  MCH 31.3  MCHC 34.2  RDW 12.0  PLT 293   Thyroid  Recent Labs  Lab 02/09/23 1436  TSH 1.884  FREET4 1.00   BNPNo results for input(s): "BNP", "PROBNP" in the last 168 hours.  DDimer No results for input(s): "DDIMER" in the last 168 hours.   Radiology/Studies:  DG Chest Portable 1 View  Result Date: 02/09/2023 CLINICAL DATA:  Chest pain, dizziness EXAM: PORTABLE CHEST 1 VIEW COMPARISON:  06/07/2017 FINDINGS: Mild cardiomegaly. Both lungs are clear. The visualized skeletal structures are unremarkable. IMPRESSION: Mild cardiomegaly without acute abnormality of the lungs in AP portable projection Electronically Signed   By: Jearld Lesch M.D.   On: 02/09/2023 15:49     Assessment and Plan:   #) CHB  *** Patient presents with 3 days of lightheadedness and dizziness, no syncope EKG with CHB Trops negative Patient was not taking any medications prior to admission Thyroid panel normal TTE pending cMRI inprocess  Lyme titers pending   He will require transfer to [facility] for PPM placement, assuming above pending workup is normal.   #) HTN Permissive HTN at this time Ok to use  Risk Assessment/Risk Scores:  {Complete the following score calculators/questions to meet required metrics.  Press F2:1}         Code Status: Full Code  Severity of Illness: The appropriate patient  status for this patient is INPATIENT. Inpatient status is judged to be reasonable and necessary in order to provide the required intensity of service to ensure the patient's safety. The patient's presenting symptoms, physical exam findings, and initial radiographic and laboratory data in the context of their chronic comorbidities is felt to place them at high risk for further clinical deterioration. Furthermore, it is not anticipated that the patient will be medically stable for discharge from the hospital within 2 midnights of admission.   * I certify that at the point of admission it is my clinical judgment that  the patient will require inpatient hospital care spanning beyond 2 midnights from the point of admission due to high intensity of service, high risk for further deterioration and high frequency of surveillance required.*   For questions or updates, please contact [facility] Please consult [email or web address] for contact info under     Signed, Sherie Don, NP  02/10/2023 11:34 AM

## 2023-02-10 NOTE — ED Notes (Signed)
called to [facility] for pt update @605am /still no bed assignment per Rep Asher Muir

## 2023-02-10 NOTE — ED Provider Notes (Signed)
-----------------------------------------   11:26 AM on 02/10/2023 ----------------------------------------- Patient has been accepted to [facility] for pacemaker placement.  Patient continues to be in a complete heart block.  Patient's workup in the emergency department shows no concerning findings from a lab work perspective.  I spoke to our local cardiologist who is also seen the patient they have ordered Lyme titer as well as a cardiac MRI.  Patient will be transferred to [facility] as a bed is now available for definitive treatment.   Minna Antis, MD 02/10/23 1126

## 2023-02-10 NOTE — Plan of Care (Signed)

## 2023-02-10 NOTE — H&P (Signed)
Cardiology Admission History and Physical   Patient ID: Jaime Moore MRN: [id number]; DOB: Jun 13, 1970   Admission date: 02/10/2023  PCP:  Patient, No Pcp Per   [facility] Providers Cardiologist:  None        Chief Complaint:  dizziness, lightheadedness  Patient Profile:   Jaime Moore is a 52 y.o. male with PMH of HTN who is being seen 02/10/2023 for the evaluation of CHB.   History of Present Illness:   Jaime Moore presented to [facility] ED on 10/13 with 2 days of lightheadedness and dizziness. He has had these symptoms before transiently and thought his blood pressure was high. He does not have a PCP. He does not take medications for his HTN. He denies any syncopal events. Did have some mild chest discomfort and has had increased indigestion and belching. He denies lower extremity edema. EKG on arrival showed CHB with ventricular escape in the 40s. He has been hemodynamically stable. [place] exposure. He has dogs but no farm animals. He denies any rash. He was transferred to [facility] for further evaluation.   Currently, he is asymptomatic sitting at rest in his room with no new or acute complaints. His significant other and brother have arrived at bedside.   He works as Metallurgist.  No past medical history on file.  No past surgical history on file.   Medications Prior to Admission: Prior to Admission medications   Not on File     Allergies:   No Known Allergies  Social History:   Social History   Socioeconomic History   Marital status: Single    Spouse name: Not on file   Number of children: Not on file   Years of education: Not on file   Highest education level: Not on file  Occupational History   Not on file  Tobacco Use   Smoking status: Never   Smokeless tobacco: Never  Vaping Use   Vaping status: Never Used  Substance and Sexual Activity   Alcohol use: No   Drug use: No   Sexual activity: Not on file   Other Topics Concern   Not on file  Social History Narrative   Not on file   Social Determinants of Health   Financial Resource Strain: Not on file  Food Insecurity: Not on file  Transportation Needs: Not on file  Physical Activity: Not on file  Stress: Not on file  Social Connections: Not on file  Intimate Partner Violence: Not on file    Family History:   The patient's family history is not on file.    ROS:  Please see the history of present illness.  All other ROS reviewed and negative.     Physical Exam/Data:   Vitals:   02/10/23 1250  BP: (!) 145/96  Pulse: 90  Resp: 18  Temp: 98.8 F (37.1 C)  TempSrc: Oral  SpO2: 96%  Weight: 90 kg   No intake or output data in the 24 hours ending 02/10/23 1534    02/10/2023   12:50 PM 02/09/2023    2:32 PM 10/06/2018   11:05 AM  Last 3 Weights  Weight (lbs) 198 lb 6.6 oz 201 lb 8 oz 201 lb 8 oz  Weight (kg) 90 kg 91.4 kg 91.4 kg     Body mass index is 31.08 kg/m.  General:  Well nourished, well developed, in no acute distress HEENT: Normal Neck: No JVD Cardiac: Bradycardic,  regular rhyth Lungs: Normal work of breathing Abd: Soft, non-distended Ext: No edema Skin: Warm and dry  Neuro:  CNs 2-12 intact, no gross focal abnormalities noted Psych:  Normal affect    EKG:   CHB, ventricular escape at 40bpm  Laboratory Data: Lyme serologies pending  High Sensitivity Troponin:   Recent Labs  Lab 02/09/23 1436  TROPONINIHS 9      Chemistry Recent Labs  Lab 02/09/23 1436  NA 138  K 3.6  CL 103  CO2 23  GLUCOSE 114*  BUN 16  CREATININE 1.01  CALCIUM 9.4  MG 2.2  GFRNONAA >60  ANIONGAP 12    No results for input(s): "PROT", "ALBUMIN", "AST", "ALT", "ALKPHOS", "BILITOT" in the last 168 hours. Lipids No results for input(s): "CHOL", "TRIG", "HDL", "LABVLDL", "LDLCALC", "CHOLHDL" in the last 168 hours. Hematology Recent Labs  Lab 02/09/23 1436  WBC 11.1*  RBC 4.47  HGB 14.0  HCT 40.9  MCV 91.5   MCH 31.3  MCHC 34.2  RDW 12.0  PLT 293   Thyroid  Recent Labs  Lab 02/09/23 1436  TSH 1.884  FREET4 1.00   BNPNo results for input(s): "BNP", "PROBNP" in the last 168 hours.  DDimer No results for input(s): "DDIMER" in the last 168 hours.   Radiology/Studies:  MR CARDIAC MORPHOLOGY W WO CONTRAST  Result Date: 02/10/2023 CLINICAL DATA:  Complete heart block EXAM: CARDIAC MRI TECHNIQUE: The patient was scanned on a 1.5 Tesla Siemens magnet. A dedicated cardiac coil was used. Functional imaging was done using Fiesta sequences. 2,3, and 4 chamber views were done to assess for RWMA's. Modified Simpson's rule using a short axis stack was used to calculate an ejection fraction on a dedicated work [employer], [employer]. The patient received 13 cc of Gadavist. After 10 minutes inversion recovery sequences were used to assess for infiltration and scar tissue. Velocity flow mapping performed in the ascending aorta and main pulmonary artery. CONTRAST:  13 cc  of Gadavist FINDINGS: 1. Normal left ventricular size, thickness and systolic function (LVEF =67%). There are no regional wall motion abnormalities. There is no late gadolinium enhancement in the left ventricular myocardium. LVEDV: 199 ml LVESV: 66 ml SV: 134 ml CO: 5L/min Myocardial mass: 97 g Native LV T1 value 1040 ms.  (Normal <1000 ms) LV ECV 25.6%.  (Normal < 30%). 2. Normal right ventricular size, thickness and systolic function (RVEF = 61%). There are no regional wall motion abnormalities. 3.  Normal left and right atrial size. 4. Normal size of the aortic root, ascending aorta and pulmonary artery. 5.  No significant valvular abnormalities. 6.  Normal pericardium.  No pericardial effusion. IMPRESSION: 1. Normal LV size and systolic function.  LVEF 67%. 2. No LGE or scar. 3. No evidence for myocardial infiltration. 4. Normal RV size and function. 5. No significant valvular abnormalities. Electronically Signed   By: Debbe Odea  M.D.   On: 02/10/2023 13:50   MR CARDIAC VELOCITY FLOW MAP  Result Date: 02/10/2023 CLINICAL DATA:  Complete heart block EXAM: CARDIAC MRI TECHNIQUE: The patient was scanned on a 1.5 Tesla Siemens magnet. A dedicated cardiac coil was used. Functional imaging was done using Fiesta sequences. 2,3, and 4 chamber views were done to assess for RWMA's. Modified Simpson's rule using a short axis stack was used to calculate an ejection fraction on a dedicated work [employer] officer, trade [employer]. The patient received 13 cc of Gadavist. After 10 minutes inversion recovery sequences were used to assess for  infiltration and scar tissue. Velocity flow mapping performed in the ascending aorta and main pulmonary artery. CONTRAST:  13 cc  of Gadavist FINDINGS: 1. Normal left ventricular size, thickness and systolic function (LVEF =67%). There are no regional wall motion abnormalities. There is no late gadolinium enhancement in the left ventricular myocardium. LVEDV: 199 ml LVESV: 66 ml SV: 134 ml CO: 5L/min Myocardial mass: 97 g Native LV T1 value 1040 ms.  (Normal <1000 ms) LV ECV 25.6%.  (Normal < 30%). 2. Normal right ventricular size, thickness and systolic function (RVEF = 61%). There are no regional wall motion abnormalities. 3.  Normal left and right atrial size. 4. Normal size of the aortic root, ascending aorta and pulmonary artery. 5.  No significant valvular abnormalities. 6.  Normal pericardium.  No pericardial effusion. IMPRESSION: 1. Normal LV size and systolic function.  LVEF 67%. 2. No LGE or scar. 3. No evidence for myocardial infiltration. 4. Normal RV size and function. 5. No significant valvular abnormalities. Electronically Signed   By: Debbe Odea M.D.   On: 02/10/2023 13:50   MR CARDIAC VELOCITY FLOW MAP  Result Date: 02/10/2023 CLINICAL DATA:  Complete heart block EXAM: CARDIAC MRI TECHNIQUE: The patient was scanned on a 1.5 Tesla Siemens magnet. A dedicated cardiac coil was used. Functional  imaging was done using Fiesta sequences. 2,3, and 4 chamber views were done to assess for RWMA's. Modified Simpson's rule using a short axis stack was used to calculate an ejection fraction on a dedicated work [employer] officer, trade union. The patient received 13 cc of Gadavist. After 10 minutes inversion recovery sequences were used to assess for infiltration and scar tissue. Velocity flow mapping performed in the ascending aorta and main pulmonary artery. CONTRAST:  13 cc  of Gadavist FINDINGS: 1. Normal left ventricular size, thickness and systolic function (LVEF =67%). There are no regional wall motion abnormalities. There is no late gadolinium enhancement in the left ventricular myocardium. LVEDV: 199 ml LVESV: 66 ml SV: 134 ml CO: 5L/min Myocardial mass: 97 g Native LV T1 value 1040 ms.  (Normal <1000 ms) LV ECV 25.6%.  (Normal < 30%). 2. Normal right ventricular size, thickness and systolic function (RVEF = 61%). There are no regional wall motion abnormalities. 3.  Normal left and right atrial size. 4. Normal size of the aortic root, ascending aorta and pulmonary artery. 5.  No significant valvular abnormalities. 6.  Normal pericardium.  No pericardial effusion. IMPRESSION: 1. Normal LV size and systolic function.  LVEF 67%. 2. No LGE or scar. 3. No evidence for myocardial infiltration. 4. Normal RV size and function. 5. No significant valvular abnormalities. Electronically Signed   By: Debbe Odea M.D.   On: 02/10/2023 13:50     ASSESSMENT: Jhoan Schmieder is a 52 y.o. male with PMH of HTN who presented to the ED with dizziness and lightheadedness and was found to have complete heart block. Cardiac MRI was performed which was normal. Lyme serologies were drawn at [facility] and are pending.   PROBLEM LIST: Complete heart block  PLAN: - Pending result of Lyme serologies, will implant dual chamber pacemaker with LBBAP lead if negative. - Keep pacer pads in place.    Signed, Nobie Putnam, MD   02/10/2023 3:34 PM

## 2023-02-10 NOTE — ED Provider Notes (Signed)
6:16 AM  Pt awaiting bed at [facility] for third-degree heart block.  Continues to be hemodynamically stable, awake, alert without complaints.   Mckenley Birenbaum, Layla Maw, DO 02/10/23 [phone]

## 2023-02-11 ENCOUNTER — Other Ambulatory Visit: Payer: Self-pay

## 2023-02-11 ENCOUNTER — Inpatient Hospital Stay (HOSPITAL_COMMUNITY): Payer: Self-pay

## 2023-02-11 ENCOUNTER — Encounter (HOSPITAL_COMMUNITY): Admission: RE | Disposition: A | Discharge status: Home / Self Care | Payer: Self-pay | Attending: Cardiology

## 2023-02-11 DIAGNOSIS — I442 Atrioventricular block, complete: Secondary | ICD-10-CM

## 2023-02-11 HISTORY — PX: PACEMAKER IMPLANT: EP1218

## 2023-02-11 LAB — CBC
HCT: 41.2 % (ref 39.0–52.0)
Hemoglobin: 14 g/dL (ref 13.0–17.0)
MCH: 31.7 pg (ref 26.0–34.0)
MCHC: 34 g/dL (ref 30.0–36.0)
MCV: 93.4 fL (ref 80.0–100.0)
Platelets: 258 10*3/uL (ref 150–400)
RBC: 4.41 MIL/uL (ref 4.22–5.81)
RDW: 12.2 % (ref 11.5–15.5)
WBC: 12 10*3/uL — ABNORMAL HIGH (ref 4.0–10.5)
nRBC: 0 % (ref 0.0–0.2)

## 2023-02-11 LAB — ECHOCARDIOGRAM COMPLETE
S' Lateral: 3 cm
Weight: 3177.6 [oz_av]

## 2023-02-11 LAB — BASIC METABOLIC PANEL
Anion gap: 12 (ref 5–15)
BUN: 16 mg/dL (ref 6–20)
CO2: 24 mmol/L (ref 22–32)
Calcium: 9.4 mg/dL (ref 8.9–10.3)
Chloride: 104 mmol/L (ref 98–111)
Creatinine, Ser: 1.15 mg/dL (ref 0.61–1.24)
GFR, Estimated: >60 mL/min (ref >60)
Glucose, Bld: 97 mg/dL (ref 70–99)
Potassium: 3.9 mmol/L (ref 3.5–5.1)
Sodium: 140 mmol/L (ref 135–145)

## 2023-02-11 LAB — LYME DISEASE SEROLOGY W/REFLEX: Lyme Total Antibody EIA: NEGATIVE

## 2023-02-11 LAB — MAGNESIUM: Magnesium: 2.3 mg/dL (ref 1.7–2.4)

## 2023-02-11 SURGERY — PACEMAKER IMPLANT

## 2023-02-11 MED ORDER — ONDANSETRON HCL 4 MG/2ML IJ SOLN: 4.0000 mg | Freq: Four times a day (QID) | INTRAMUSCULAR | Status: DC | PRN

## 2023-02-11 MED ORDER — HEPARIN (PORCINE) IN NACL 1000-0.9 UT/500ML-% IV SOLN
INTRAVENOUS | Status: DC | PRN
  Administered 2023-02-11: 500 mL

## 2023-02-11 MED ORDER — SODIUM CHLORIDE 0.9 % IV SOLN
INTRAVENOUS | Status: AC
  Administered 2023-02-11: 80 mg
  Filled 2023-02-11: qty 2

## 2023-02-11 MED ORDER — MIDAZOLAM HCL 5 MG/5ML IJ SOLN
INTRAMUSCULAR | Status: AC
  Filled 2023-02-11: qty 5

## 2023-02-11 MED ORDER — LIDOCAINE HCL (PF) 1 % IJ SOLN
INTRAMUSCULAR | Status: DC | PRN
  Administered 2023-02-11: 55 mL

## 2023-02-11 MED ORDER — LIDOCAINE HCL 1 % IJ SOLN
INTRAMUSCULAR | Status: AC
  Filled 2023-02-11: qty 40

## 2023-02-11 MED ORDER — CEFAZOLIN SODIUM-DEXTROSE 2-4 GM/100ML-% IV SOLN
INTRAVENOUS | Status: AC
  Filled 2023-02-11: qty 100

## 2023-02-11 MED ORDER — FENTANYL CITRATE (PF) 100 MCG/2ML IJ SOLN
INTRAMUSCULAR | Status: AC
  Filled 2023-02-11: qty 2

## 2023-02-11 MED ORDER — CEFAZOLIN SODIUM-DEXTROSE 1-4 GM/50ML-% IV SOLN
1.0000 g | Freq: Four times a day (QID) | INTRAVENOUS | Status: AC
  Administered 2023-02-11 – 2023-02-12 (×3): 1 g via INTRAVENOUS
  Filled 2023-02-11 (×4): qty 50

## 2023-02-11 MED ORDER — LIDOCAINE HCL 1 % IJ SOLN
INTRAMUSCULAR | Status: AC
  Filled 2023-02-11: qty 20

## 2023-02-11 SURGICAL SUPPLY — 14 items
CABLE SURGICAL S-101-97-12 (CABLE) ×1 IMPLANT
CATH CPS LOCATOR 3D MED (CATHETERS) IMPLANT
HELIX LOCKING TOOL (MISCELLANEOUS) ×1
LEAD ULTIPACE 52 LPA1231/52 (Lead) IMPLANT
LEAD ULTIPACE 65 LPA1231/65 (Lead) IMPLANT
PACEMAKER ASSURITY DR-RF (Pacemaker) IMPLANT
PAD DEFIB RADIO PHYSIO CONN (PAD) ×1 IMPLANT
SHEATH 7FR PRELUDE SNAP 13 (SHEATH) IMPLANT
SHEATH 9FR PRELUDE SNAP 13 (SHEATH) IMPLANT
SHEATH PROBE COVER 6X72 (BAG) IMPLANT
SLITTER AGILIS HISPRO (INSTRUMENTS) IMPLANT
TOOL HELIX LOCKING (MISCELLANEOUS) IMPLANT
TRAY PACEMAKER INSERTION (PACKS) ×1 IMPLANT
WIRE HI TORQ VERSACORE-J 145CM (WIRE) IMPLANT

## 2023-02-11 NOTE — H&P (View-Only) (Signed)
Rounding Note    Patient Name: Jaime Moore Date of Encounter: 02/11/2023  [facility] Cardiologist: None   Subjective   Feels wll, no CP, palpitations, SOB, ambulated last night/this AM without difficulty  Inpatient Medications    Scheduled Meds:  gentamicin (GARAMYCIN) 80 mg in sodium chloride 0.9 % 500 mL irrigation  80 mg Irrigation On Call   sodium chloride flush  3 mL Intravenous Q12H   Continuous Infusions:  sodium chloride 50 mL/hr at 02/11/23 1610   sodium chloride      ceFAZolin (ANCEF) IV     PRN Meds: acetaminophen, sodium chloride flush   Vital Signs    Vitals:   02/11/23 0356 02/11/23 0357 02/11/23 0358 02/11/23 0815  BP:   (!) 129/58 (!) 128/53  Pulse: (!) 30 (!) 29 81 (!) 31  Resp:   18 18  Temp:   98.4 F (36.9 C) 98.4 F (36.9 C)  TempSrc:   Oral Oral  SpO2: 98% 97% 96% 99%  Weight:       No intake or output data in the 24 hours ending 02/11/23 0819    02/11/2023    1:44 AM 02/10/2023   12:50 PM 02/09/2023    2:32 PM  Last 3 Weights  Weight (lbs) 198 lb 9.6 oz 198 lb 6.6 oz 201 lb 8 oz  Weight (kg) 90.084 kg 90 kg 91.4 kg      Telemetry    CHB 30's - Personally Reviewed  ECG    No new EKGs - Personally Reviewed  Physical Exam   GEN: No acute distress.   Neck: No JVD Cardiac: RRR, bradycardic, no murmurs, rubs, or gallops.  Respiratory: Clear to auscultation bilaterally. GI: Soft, nontender, non-distended  MS: No edema; No deformity. Neuro:  Nonfocal  Psych: Normal affect   Labs    High Sensitivity Troponin:   Recent Labs  Lab 02/09/23 1436  TROPONINIHS 9     Chemistry Recent Labs  Lab 02/09/23 1436 02/11/23 0407  NA 138 140  K 3.6 3.9  CL 103 104  CO2 23 24  GLUCOSE 114* 97  BUN 16 16  CREATININE 1.01 1.15  CALCIUM 9.4 9.4  MG 2.2 2.3  GFRNONAA >60 >60  ANIONGAP 12 12    Lipids No results for input(s): "CHOL", "TRIG", "HDL", "LABVLDL", "LDLCALC", "CHOLHDL" in the last 168 hours.   Hematology Recent Labs  Lab 02/09/23 1436 02/11/23 0407  WBC 11.1* 12.0*  RBC 4.47 4.41  HGB 14.0 14.0  HCT 40.9 41.2  MCV 91.5 93.4  MCH 31.3 31.7  MCHC 34.2 34.0  RDW 12.0 12.2  PLT 293 258   Thyroid  Recent Labs  Lab 02/09/23 1436  TSH 1.884  FREET4 1.00    BNPNo results for input(s): "BNP", "PROBNP" in the last 168 hours.  DDimer No results for input(s): "DDIMER" in the last 168 hours.   Radiology      Cardiac Studies    02/10/23, c.MRI IMPRESSION: 1. Normal LV size and systolic function.  LVEF 67%. 2. No LGE or scar. 3. No evidence for myocardial infiltration. 4. Normal RV size and function. 5. No significant valvular abnormalities.   02/10/23: TTE 1. The left ventricle has normal systolic function with an ejection  fraction of 60-65%. The cavity size was normal. Left ventricular diastolic  parameters were normal. No evidence of left ventricular regional wall  motion abnormalities.   2. The right ventricle has normal systolic function. The cavity was  normal.  There is no increase in right ventricular wall thickness.   3. The mitral valve is grossly normal.   4. The tricuspid valve is grossly normal.   5. The aortic valve is grossly normal. Aortic valve regurgitation was not  assessed by color flow Doppler.    Patient Profile     52 y.o. male w/PMHx of HTN admitted to [facility] initially where he went with dizziness, lightheadedness and found in CHB  Given young age, c.MRI and Lymes titers planned given no other reversible causes were noted, on no out patient medications  Assessment & Plan    CHB Hemodynamically stable, minimal if any symptoms at rest with good BP Ambulated yesterday evening and this AM without significant symptoms. 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

## 2023-02-11 NOTE — Interval H&P Note (Signed)
History and Physical Interval Note:  02/11/2023 11:59 AM  Jaime Moore  has presented today for surgery, with the diagnosis of heart block.  The various methods of treatment have been discussed with the patient and family. After consideration of risks, benefits and other options for treatment, the patient has consented to  Procedure(s): PACEMAKER IMPLANT (N/A) as a surgical intervention.  The patient's history has been reviewed, patient examined, no change in status, stable for surgery.  I have reviewed the patient's chart and labs.  Questions were answered to the patient's satisfaction.     Alon Mazor [employer]

## 2023-02-11 NOTE — Progress Notes (Signed)
Rounding Note    Patient Name: Jaime Moore Date of Encounter: 02/11/2023  [facility] Cardiologist: None   Subjective   Feels wll, no CP, palpitations, SOB, ambulated last night/this AM without difficulty  Inpatient Medications    Scheduled Meds:  gentamicin (GARAMYCIN) 80 mg in sodium chloride 0.9 % 500 mL irrigation  80 mg Irrigation On Call   sodium chloride flush  3 mL Intravenous Q12H   Continuous Infusions:  sodium chloride 50 mL/hr at 02/11/23 1610   sodium chloride      ceFAZolin (ANCEF) IV     PRN Meds: acetaminophen, sodium chloride flush   Vital Signs    Vitals:   02/11/23 0356 02/11/23 0357 02/11/23 0358 02/11/23 0815  BP:   (!) 129/58 (!) 128/53  Pulse: (!) 30 (!) 29 81 (!) 31  Resp:   18 18  Temp:   98.4 F (36.9 C) 98.4 F (36.9 C)  TempSrc:   Oral Oral  SpO2: 98% 97% 96% 99%  Weight:       No intake or output data in the 24 hours ending 02/11/23 0819    02/11/2023    1:44 AM 02/10/2023   12:50 PM 02/09/2023    2:32 PM  Last 3 Weights  Weight (lbs) 198 lb 9.6 oz 198 lb 6.6 oz 201 lb 8 oz  Weight (kg) 90.084 kg 90 kg 91.4 kg      Telemetry    CHB 30's - Personally Reviewed  ECG    No new EKGs - Personally Reviewed  Physical Exam   GEN: No acute distress.   Neck: No JVD Cardiac: RRR, bradycardic, no murmurs, rubs, or gallops.  Respiratory: Clear to auscultation bilaterally. GI: Soft, nontender, non-distended  MS: No edema; No deformity. Neuro:  Nonfocal  Psych: Normal affect   Labs    High Sensitivity Troponin:   Recent Labs  Lab 02/09/23 1436  TROPONINIHS 9     Chemistry Recent Labs  Lab 02/09/23 1436 02/11/23 0407  NA 138 140  K 3.6 3.9  CL 103 104  CO2 23 24  GLUCOSE 114* 97  BUN 16 16  CREATININE 1.01 1.15  CALCIUM 9.4 9.4  MG 2.2 2.3  GFRNONAA >60 >60  ANIONGAP 12 12    Lipids No results for input(s): "CHOL", "TRIG", "HDL", "LABVLDL", "LDLCALC", "CHOLHDL" in the last 168 hours.   Hematology Recent Labs  Lab 02/09/23 1436 02/11/23 0407  WBC 11.1* 12.0*  RBC 4.47 4.41  HGB 14.0 14.0  HCT 40.9 41.2  MCV 91.5 93.4  MCH 31.3 31.7  MCHC 34.2 34.0  RDW 12.0 12.2  PLT 293 258   Thyroid  Recent Labs  Lab 02/09/23 1436  TSH 1.884  FREET4 1.00    BNPNo results for input(s): "BNP", "PROBNP" in the last 168 hours.  DDimer No results for input(s): "DDIMER" in the last 168 hours.   Radiology      Cardiac Studies    02/10/23, c.MRI IMPRESSION: 1. Normal LV size and systolic function.  LVEF 67%. 2. No LGE or scar. 3. No evidence for myocardial infiltration. 4. Normal RV size and function. 5. No significant valvular abnormalities.   02/10/23: TTE 1. The left ventricle has normal systolic function with an ejection  fraction of 60-65%. The cavity size was normal. Left ventricular diastolic  parameters were normal. No evidence of left ventricular regional wall  motion abnormalities.   2. The right ventricle has normal systolic function. The cavity was  normal.  There is no increase in right ventricular wall thickness.   3. The mitral valve is grossly normal.   4. The tricuspid valve is grossly normal.   5. The aortic valve is grossly normal. Aortic valve regurgitation was not  assessed by color flow Doppler.    Patient Profile     52 y.o. male w/PMHx of HTN admitted to [facility] initially where he went with dizziness, lightheadedness and found in CHB  Given young age, c.MRI and Lymes titers planned given no other reversible causes were noted, on no out patient medications  Assessment & Plan    CHB Hemodynamically stable, minimal if any symptoms at rest with good BP Ambulated yesterday evening and this AM without significant symptoms. 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

## 2023-02-12 ENCOUNTER — Encounter (HOSPITAL_COMMUNITY): Payer: Self-pay | Admitting: Cardiology

## 2023-02-12 ENCOUNTER — Inpatient Hospital Stay (HOSPITAL_COMMUNITY): Payer: Self-pay

## 2023-02-12 MED FILL — Midazolam HCl Inj 5 MG/5ML (Base Equivalent): INTRAMUSCULAR | Qty: 5 | Status: AC

## 2023-02-12 MED FILL — Fentanyl Citrate Preservative Free (PF) Inj 100 MCG/2ML: INTRAMUSCULAR | Qty: 2 | Status: AC

## 2023-02-12 NOTE — Discharge Instructions (Signed)
After Your Pacemaker   You have a [employer] Pacemaker  ACTIVITY Do not lift your arm above shoulder height for 1 week after your procedure. After 7 days, you may progress as below.  You should remove your sling 24 hours after your procedure, unless otherwise instructed by your provider.     Wednesday February 19, 2023  Thursday February 20, 2023 Friday February 21, 2023 Saturday February 22, 2023   Do not lift, push, pull, or carry anything over 10 pounds with the affected arm until 6 weeks (Wednesday March 26, 2023 ) after your procedure.   You may drive AFTER your wound check, unless you have been told otherwise by your provider.   Ask your healthcare provider when you can go back to work   INCISION/Dressing Monitor your Pacemaker site for redness, swelling, and drainage. Call the device clinic at [phone] if you experience these symptoms or fever/chills.  If your incision is sealed with Steri-strips or staples, you may shower 7 days after your procedure or when told by your provider. Do not remove the steri-strips or let the shower hit directly on your site. You may wash around your site with soap and water.    If you were discharged in a sling, please do not wear this during the day more than 48 hours after your surgery unless otherwise instructed. This may increase the risk of stiffness and soreness in your shoulder.   Avoid lotions, ointments, or perfumes over your incision until it is well-healed.  You may use a hot tub or a pool AFTER your wound check appointment if the incision is completely closed.  Pacemaker Alerts:  Some alerts are vibratory and others beep. These are NOT emergencies. Please call our office to let us know. If this occurs at night or on weekends, it can wait until the next business day. Send a remote transmission.  If your device is capable of reading fluid status (for heart failure), you will be offered monthly monitoring to review this with you.    DEVICE MANAGEMENT Remote monitoring is used to monitor your pacemaker from home. This monitoring is scheduled every 91 days by our office. It allows [place] to keep an eye on the functioning of your device to ensure it is working properly. You will routinely see your Electrophysiologist annually (more often if necessary).   You should receive your ID card for your new device in 4-8 weeks. Keep this card with you at all times once received. Consider wearing a medical alert bracelet or necklace.  Your Pacemaker may be MRI compatible. This will be discussed at your next office visit/wound check.  You should avoid contact with strong electric or magnetic fields.   Do not use amateur (ham) radio equipment or electric (arc) welding torches. MP3 player headphones with magnets should not be used. Some devices are safe to use if held at least 12 inches (30 cm) from your Pacemaker. These include power tools, lawn mowers, and speakers. If you are unsure if something is safe to use, ask your health care provider.  When using your cell phone, hold it to the ear that is on the opposite side from the Pacemaker. Do not leave your cell phone in a pocket over the Pacemaker.  You may safely use electric blankets, heating pads, computers, and microwave ovens.  Call the office right away if: You have chest pain. You feel more short of breath than you have felt before. You feel more light-headed than you have felt  before. Your incision starts to open up.  This information is not intended to replace advice given to you by your health care provider. Make sure you discuss any questions you have with your health care provider.

## 2023-02-12 NOTE — Discharge Summary (Addendum)
ELECTROPHYSIOLOGY PROCEDURE DISCHARGE SUMMARY    Patient ID: Jaime Moore,  MRN: [id number], DOB/AGE: 06/16/1970 52 y.o.  Admit date: 02/10/2023 Discharge date: 02/12/2023  Primary Care Physician: Patient, No Pcp Per  Electrophysiologist: new to Dr. Elberta Fortis  Primary Discharge Diagnosis:  CHB  Secondary Discharge Diagnosis:  HTN ( not on meds)  No Known Allergies   Procedures This Admission:  1.  Implantation of a Abbott dual chamber PPM on 02/11/23 by Dr Elberta Fortis.   There were no immediate post procedure complications. CXR on 02/12/23 demonstrated no pneumothorax status post device implantation.   Brief HPI: Issaiah Seabrooks is a 52 y.o. male sought medical attention at [facility] with a few days of dizziness, lightheaded spells, found in CHB.  Transferred to [facility] for EP to see and consider [facility]  Hospital Course:  The patient was taking no medicines at home, labs without explanation of CHB, given young age, c.MRI and lyme titer planned, [place], for any potential tick exposure. C.MRI was normal, LVEF preserved, Lymes negative and recommended PPM with no reversible caus found.  He underwent implantation of a PPM with details as outlined in the procedure report.  He was monitored on telemetry overnight which demonstrated CHB >> AV paced post implant.  Left chest was without hematoma or ecchymosis.  The device was interrogated and found to be functioning normally.  CXR was obtained and demonstrated no pneumothorax status post device implantation.  Wound care, arm mobility, and restrictions were reviewed with the patient.  The patient feels well, denies any CP/SOB, with minimal site discomfort.  He was examined by Dr. Elberta Fortis and considered stable for discharge to home.   BP without medication has settled 110's-130/60's-80s He does not have a PMD, they do have a BP cuff available to them, I have asked he keep an eye on his BP and reach out to [place] if persistently  elevated. I provided information for community health and wellness center as a resource for him as well.   Physical Exam: Vitals:   02/11/23 1934 02/12/23 0050 02/12/23 0418 02/12/23 0723  BP: 131/65 116/69 126/78 (!) 149/90  Pulse: 81 70 68 64  Resp: 18 17 18 18   Temp: (!) 97.5 F (36.4 C) 97.8 F (36.6 C) 97.8 F (36.6 C) 97.9 F (36.6 C)  TempSrc: Oral Oral Oral Oral  SpO2: 96% 99% 99% 98%  Weight:        GEN- The patient is well appearing, alert and oriented x 3 today.   HEENT: normocephalic, atraumatic; sclera clear, conjunctiva pink; hearing intact; oropharynx clear; neck supple, no JVP Lungs- CTA b/l, normal work of breathing.  No wheezes, rales, rhonchi Heart- RRR, no murmurs, rubs or gallops, PMI not laterally displaced GI- soft, non-tender, non-distended Extremities- no clubbing, cyanosis, or edema MS- no significant deformity or atrophy Skin- warm and dry, no rash or lesion, left chest without hematoma/ecchymosis Psych- euthymic mood, full affect Neuro- no gross deficits   Labs:   Lab Results  Component Value Date   WBC 12.0 (H) 02/11/2023   HGB 14.0 02/11/2023   HCT 41.2 02/11/2023   MCV 93.4 02/11/2023   PLT 258 02/11/2023    Recent Labs  Lab 02/11/23 0407  NA 140  K 3.9  CL 104  CO2 24  BUN 16  CREATININE 1.15  CALCIUM 9.4  GLUCOSE 97    Discharge Medications:  Allergies as of 02/12/2023   No Known Allergies  Medication List    You have not been prescribed any medications.     Disposition: Home Discharge Instructions     Diet - low sodium heart healthy   Complete by: As directed    Increase activity slowly   Complete by: As directed        Follow-up Information     [facility] Follow up.   Why: please call for an appointment to establish care for primarycare and medial needs Contact information: [address] [place] [address] [phone]                 Duration of Discharge Encounter: Greater than 30 minutes including physician time.  Signed, Francis Dowse, PA-C 02/12/2023 11:15 AM   I have seen and examined this patient with Francis Dowse.  Agree with above, note added to reflect my findings.  On exam, RRR< no murmurs, lungs clear.  She is now status post Abbott pacemaker for complete AV block.  Device functioning appropriately.  Chest x-ray and interrogation without issue.  Plan for discharge today with follow-up in device clinic.  Yannely Kintzel M. Watt Geiler MD 02/12/2023 11:21 AM

## 2023-02-14 ENCOUNTER — Ambulatory Visit: Payer: Self-pay | Attending: Internal Medicine | Admitting: Internal Medicine

## 2023-02-14 ENCOUNTER — Other Ambulatory Visit: Payer: Self-pay

## 2023-02-14 ENCOUNTER — Encounter: Payer: Self-pay | Admitting: Internal Medicine

## 2023-02-14 ENCOUNTER — Ambulatory Visit: Payer: Self-pay

## 2023-02-14 VITALS — BP 140/80 | HR 73 | Temp 97.9°F | Ht 67.0 in | Wt 198.6 lb

## 2023-02-14 DIAGNOSIS — Z7689 Persons encountering health services in other specified circumstances: Secondary | ICD-10-CM

## 2023-02-14 DIAGNOSIS — Z23 Encounter for immunization: Secondary | ICD-10-CM

## 2023-02-14 DIAGNOSIS — Z950 Presence of cardiac pacemaker: Secondary | ICD-10-CM | POA: Insufficient documentation

## 2023-02-14 DIAGNOSIS — Z2821 Immunization not carried out because of patient refusal: Secondary | ICD-10-CM

## 2023-02-14 DIAGNOSIS — R0600 Dyspnea, unspecified: Secondary | ICD-10-CM

## 2023-02-14 DIAGNOSIS — R079 Chest pain, unspecified: Secondary | ICD-10-CM

## 2023-02-14 DIAGNOSIS — F419 Anxiety disorder, unspecified: Secondary | ICD-10-CM | POA: Insufficient documentation

## 2023-02-14 DIAGNOSIS — I10 Essential (primary) hypertension: Secondary | ICD-10-CM

## 2023-02-14 DIAGNOSIS — F418 Other specified anxiety disorders: Secondary | ICD-10-CM

## 2023-02-14 DIAGNOSIS — I442 Atrioventricular block, complete: Secondary | ICD-10-CM

## 2023-02-14 DIAGNOSIS — F320 Major depressive disorder, single episode, mild: Secondary | ICD-10-CM | POA: Insufficient documentation

## 2023-02-14 DIAGNOSIS — Z5986 Financial insecurity: Secondary | ICD-10-CM | POA: Insufficient documentation

## 2023-02-14 MED ORDER — OMEPRAZOLE 20 MG PO CPDR
20.0000 mg | DELAYED_RELEASE_CAPSULE | Freq: Every day | ORAL | 1 refills | Status: DC
Start: 2023-02-14 — End: 2023-02-14

## 2023-02-14 MED ORDER — ASPIRIN 81 MG PO TBEC: 81.0000 mg | DELAYED_RELEASE_TABLET | Freq: Every day | ORAL | Status: DC

## 2023-02-14 MED ORDER — AMLODIPINE BESYLATE 5 MG PO TABS
5.0000 mg | ORAL_TABLET | Freq: Every day | ORAL | 2 refills | Status: DC
Start: 2023-02-14 — End: 2023-02-14

## 2023-02-14 MED ORDER — TETANUS-DIPHTH-ACELL PERTUSSIS 5-2-15.5 LF-MCG/0.5 IM SUSP: 0.5000 mL | Freq: Once | INTRAMUSCULAR | 0 refills | Status: AC

## 2023-02-14 MED ORDER — AMLODIPINE BESYLATE 5 MG PO TABS
5.0000 mg | ORAL_TABLET | Freq: Every day | ORAL | 2 refills | Status: DC
  Filled 2023-02-14: qty 30, 30d supply, fill #0
  Filled 2023-03-11: qty 30, 30d supply, fill #1
  Filled 2023-04-13 – 2023-04-14 (×2): qty 30, 30d supply, fill #2

## 2023-02-14 MED ORDER — ASPIRIN 81 MG PO TBEC: 81.0000 mg | DELAYED_RELEASE_TABLET | Freq: Every day | ORAL | 0 refills | Status: DC

## 2023-02-14 MED ORDER — OMEPRAZOLE 20 MG PO CPDR
20.0000 mg | DELAYED_RELEASE_CAPSULE | Freq: Every day | ORAL | 1 refills | Status: DC
  Filled 2023-02-14: qty 90, 90d supply, fill #0
  Filled 2023-05-10: qty 90, 90d supply, fill #1

## 2023-02-14 NOTE — Telephone Encounter (Signed)
Noted. Thank you. Patient had an appointment today 02/14/2023 with Dr.Johnson.

## 2023-02-14 NOTE — Progress Notes (Signed)
Patient ID: Jaime Moore, male    DOB: 1971-04-23  MRN: [id number]  CC: Hospitalization Follow-up (Hospitalization f/u. Herold Harms rx for acid reflux/No to flu & shingles vax. Yes to Tdap vax)   Subjective: Jaime Moore is a 52 y.o. male who presents for new pt visit.  Debbie his GF is with him. His concerns today include:    Presents for new patient visit and hospital follow-up. No previous PCP. Patient hospitalized 10/14-16/2024 with dizziness.  Found to have complete heart block.  Cardiac MRI was unrevealing, echo revealed normal EF and Lyme titer was negative.  Permanent pacemaker was placed.  Blood pressure was mildly elevated during hospitalization.  This was to be followed in the outpatient setting.  Today: Patient reports dizziness is much better since permanent pacemaker was placed. -Complains of some chest pains last evening where he felt it was hard to breathe when he was laying down so he had to sit up.  Felt a burning in the chest that came and went twice.  He endorses feeling burning in the throat as well.  Gives history of acid reflux.  Ate a cheeseburger before going to bed last night.  He takes Rolaids intermittently for heartburn but has not taken 1 in several weeks.  Reports being prescribed Prilosec about 5 years ago for heartburn. -Endorses burning in the chest sometimes when he is at work exerting himself.  He works as a Presenter, broadcasting.  When he gets burning in the chest, he sits down until it goes away.  Family history of heart disease in 2 of his brothers 1 of whom died of a massive heart attack in his 39s. -Patient does not smoke or uses any street drugs.  Gives history of hypertension and was on medications in in the past.  Did not follow-up because he did not have insurance.  Depression and GAD-7 scores are positive.  He endorses some depression but states it is not a major issue for him.  He feels anxiety has increased given the recent hospitalization   Patient  Active Problem List   Diagnosis Date Noted  . Essential hypertension 02/14/2023  . Major depressive disorder, single episode, mild (HCC) 02/14/2023  . Situational anxiety 02/14/2023  . Complete heart block (HCC) 02/10/2023  . Dizziness 10/06/2018     No current outpatient medications on file prior to visit.   No current facility-administered medications on file prior to visit.    No Known Allergies  Social History   Socioeconomic History  . Marital status: Single    Spouse name: Not on file  . Number of children: Not on file  . Years of education: Not on file  . Highest education level: Not on file  Occupational History  . Occupation: Primary school teacher  Tobacco Use  . Smoking status: Never  . Smokeless tobacco: Never  Vaping Use  . Vaping status: Never Used  Substance and Sexual Activity  . Alcohol use: No  . Drug use: No  . Sexual activity: Not on file  Other Topics Concern  . Not on file  Social History Narrative  . Not on file   Social Determinants of Health   Financial Resource Strain: Medium Risk (02/14/2023)   Overall Financial Resource Strain (CARDIA)   . Difficulty of Paying Living Expenses: Somewhat hard  Food Insecurity: No Food Insecurity (02/14/2023)   Hunger Vital Sign   . Worried About Programme researcher, broadcasting/film/video in the Last Year: Never true   . Ran Out  of Food in the Last Year: Never true  Transportation Needs: Unmet Transportation Needs (02/14/2023)   PRAPARE - Transportation   . Lack of Transportation (Medical): No   . Lack of Transportation (Non-Medical): Yes  Physical Activity: Insufficiently Active (02/14/2023)   Exercise Vital Sign   . Days of Exercise per Week: 5 days   . Minutes of Exercise per Session: 20 min  Stress: Stress Concern Present (02/14/2023)   [employer] of Occupational Health - Occupational Stress Questionnaire   . Feeling of Stress : Rather much  Social Connections: Socially Isolated (02/14/2023)   Social Connection and  Isolation Panel [NHANES]   . Frequency of Communication with Friends and Family: More than three times a week   . Frequency of Social Gatherings with Friends and Family: More than three times a week   . Attends Religious Services: Never   . Active Member of Clubs or Organizations: No   . Attends Banker Meetings: Never   . Marital Status: Never married  Intimate Partner Violence: Not At Risk (02/14/2023)   Humiliation, Afraid, Rape, and Kick questionnaire   . Fear of Current or Ex-Partner: No   . Emotionally Abused: No   . Physically Abused: No   . Sexually Abused: No    Family History  Problem Relation Age of Onset  . Cancer Mother        lung CA  . Heart disease Brother   . Cancer - Other Other        lung CA  . Hyperlipidemia Other     Past Surgical History:  Procedure Laterality Date  . PACEMAKER IMPLANT N/A 02/11/2023   Procedure: PACEMAKER IMPLANT;  Surgeon: Regan Lemming, MD;  Location: [facility] INVASIVE CV LAB;  Service: Cardiovascular;  Laterality: N/A;    ROS: Review of Systems Negative except as stated above  PHYSICAL EXAM: BP (!) 140/80   Pulse 73   Temp 97.9 F (36.6 C) (Oral)   Ht 5\' 7"  (1.702 m)   Wt 198 lb 9.6 oz (90.1 kg)   SpO2 99%   BMI 31.11 kg/m   Physical Exam   General appearance - alert, well appearing, middle-age Caucasian male and in no distress Mental status - normal mood, behavior, speech, dress, motor activity, and thought processes Neck - supple, no significant adenopathy Chest -breath sounds slight decreased bilaterally but no wheezes crackles or rhonchi is heard.  Breath sounds are equal. Heart - normal rate, regular rhythm, normal S1, S2, no murmurs, rubs, clicks or gallops Extremities -no lower extremity edema. Skin: Pacemaker felt in the left upper chest.  Steritapes are still in place.    02/14/2023    2:27 PM  Depression screen PHQ 2/9  Decreased Interest 1  Down, Depressed, Hopeless 2  PHQ - 2 Score 3   Altered sleeping 3  Tired, decreased energy 2  Change in appetite 2  Feeling bad or failure about yourself  1  Trouble concentrating 3  Moving slowly or fidgety/restless 3  Suicidal thoughts 0  PHQ-9 Score 17  Difficult doing work/chores Extremely dIfficult      02/14/2023    2:27 PM  GAD 7 : Generalized Anxiety Score  Nervous, Anxious, on Edge 3  Control/stop worrying 3  Worry too much - different things 3  Trouble relaxing 3  Restless 3  Easily annoyed or irritable 3  Afraid - awful might happen 3  Total GAD 7 Score 21  Anxiety Difficulty Extremely difficult   EKG reveals  atrial sensed ventricular paced rhythm and appears unchanged from last EKG done prior to his discharge.     Latest Ref Rng & Units 02/11/2023    4:07 AM 02/09/2023    2:36 PM 10/06/2018   10:52 AM  CMP  Glucose 70 - 99 mg/dL 97  161    BUN 6 - 20 mg/dL 16  16    Creatinine 0.96 - 1.24 mg/dL 0.45  4.09  8.11   Sodium 135 - 145 mmol/L 140  138    Potassium 3.5 - 5.1 mmol/L 3.9  3.6    Chloride 98 - 111 mmol/L 104  103    CO2 22 - 32 mmol/L 24  23    Calcium 8.9 - 10.3 mg/dL 9.4  9.4     Lipid Panel     Component Value Date/Time   CHOL 203 (H) 10/07/2018 0653   TRIG 85 10/07/2018 0653   HDL 38 (L) 10/07/2018 0653   CHOLHDL 5.3 10/07/2018 0653   VLDL 17 10/07/2018 0653   LDLCALC 148 (H) 10/07/2018 0653    CBC    Component Value Date/Time   WBC 12.0 (H) 02/11/2023 0407   RBC 4.41 02/11/2023 0407   HGB 14.0 02/11/2023 0407   HCT 41.2 02/11/2023 0407   PLT 258 02/11/2023 0407   MCV 93.4 02/11/2023 0407   MCH 31.7 02/11/2023 0407   MCHC 34.0 02/11/2023 0407   RDW 12.2 02/11/2023 0407   LYMPHSABS 3.0 10/05/2018 2238   MONOABS 0.9 10/05/2018 2238   EOSABS 0.1 10/05/2018 2238   BASOSABS 0.1 10/05/2018 2238    ASSESSMENT AND PLAN: 1. Establishing care with new doctor, encounter for   2. Complete heart block (HCC) Status post permanent pacemaker placement.  He has follow-up with EP in  January.  Has wound check scheduled for the end of this month.  3. Chest pain in adult Differential diagnosis include angina versus GERD.  I have submitted referral to cardiology asking that they try to get him in much sooner than January.  In the meantime I have placed him on low-dose aspirin and omeprazole. GERD precautions discussed.  Advised to avoid certain foods like spicy foods, tomato-based foods, juices and excessive caffeine.  Advised to eat his last meal at least 2 to 3 hours before laying down at nights and to sleep with his head slightly elevated.   - EKG 12-Lead - Ambulatory referral to Cardiology - omeprazole (PRILOSEC) 20 MG capsule; Take 1 capsule (20 mg total) by mouth daily.  Dispense: 90 capsule; Refill: 1  4. PND (paroxysmal nocturnal dyspnea) - Brain natriuretic peptide  5. Essential hypertension Repeat blood pressure is better but still elevated.  Will start low-dose amlodipine. - amLODipine (NORVASC) 5 MG tablet; Take 1 tablet (5 mg total) by mouth daily.  Dispense: 30 tablet; Refill: 2  6. Major depressive disorder, single episode, mild (HCC) 7. Situational anxiety Patient declines medication.  Does not feel that he needs counseling either.  8. Influenza vaccination declined   9. Need for Tdap vaccination - Tdap (ADACEL) 08-28-13.5 LF-MCG/0.5 injection; Inject 0.5 mLs into the muscle once for 1 dose.  Dispense: 0.5 mL; Refill: 0         There are no diagnoses linked to this encounter.   Patient was given the opportunity to ask questions.  Patient verbalized understanding of the plan and was able to repeat key elements of the plan.   This documentation was completed using Paediatric nurse.  Any transcriptional  errors are unintentional.  Orders Placed This Encounter  Procedures  . Brain natriuretic peptide  . Ambulatory referral to Cardiology  . EKG 12-Lead     Requested Prescriptions   Signed Prescriptions Disp Refills  . Tdap  (ADACEL) 08-28-13.5 LF-MCG/0.5 injection 0.5 mL 0    Sig: Inject 0.5 mLs into the muscle once for 1 dose.  Marland Kitchen amLODipine (NORVASC) 5 MG tablet 30 tablet 2    Sig: Take 1 tablet (5 mg total) by mouth daily.  Marland Kitchen omeprazole (PRILOSEC) 20 MG capsule 90 capsule 1    Sig: Take 1 capsule (20 mg total) by mouth daily.  Marland Kitchen aspirin EC 81 MG tablet      Sig: Take 1 tablet (81 mg total) by mouth daily. Swallow whole.    Return in about 4 months (around 06/17/2023).  Jonah Blue, MD, FACP

## 2023-02-14 NOTE — Telephone Encounter (Signed)
     Chief Complaint: Pacemaker placement 02/11/23. Since then, pt. Has had SOB at night only. Sleeping on 3-4 pillows. No breathing difficulty now. "I feel fine." Symptoms: Above Frequency: 3 days Pertinent Negatives: Patient denies chest pain or other symptom Disposition: [] ED /[] Urgent Care (no appt availability in office) / [x] Appointment(In office/virtual)/ []  [facility]/ [] Home Care/ [] Refused Recommended Disposition /[] [facility]/ []  Follow-up with PCP Additional Notes: Pt. Has hospital follow up appointment today. Instructed to call 911 for worsening of symptoms.  Reason for Disposition  [1] MILD difficulty breathing (e.g., minimal/no SOB at rest, SOB with walking, pulse <100) AND [2] NEW-onset or WORSE than normal  Answer Assessment - Initial Assessment Questions 1. RESPIRATORY STATUS: "Describe your breathing?" (e.g., wheezing, shortness of breath, unable to speak, severe coughing)      SOB at night 2. ONSET: "When did this breathing problem begin?"      After pacemaker placement 3. PATTERN "Does the difficult breathing come and go, or has it been constant since it started?"      Comes and goes 4. SEVERITY: "How bad is your breathing?" (e.g., mild, moderate, severe)    - MILD: No SOB at rest, mild SOB with walking, speaks normally in sentences, can lie down, no retractions, pulse < 100.    - MODERATE: SOB at rest, SOB with minimal exertion and prefers to sit, cannot lie down flat, speaks in phrases, mild retractions, audible wheezing, pulse 100-120.    - SEVERE: Very SOB at rest, speaks in single words, struggling to breathe, sitting hunched forward, retractions, pulse > 120      Mild 5. RECURRENT SYMPTOM: "Have you had difficulty breathing before?" If Yes, ask: "When was the last time?" and "What happened that time?"      No 6. CARDIAC HISTORY: "Do you have any history of heart disease?" (e.g., heart attack, angina, bypass surgery, angioplasty)       Yes 7. LUNG HISTORY: "Do you have any history of lung disease?"  (e.g., pulmonary embolus, asthma, emphysema)     No 8. CAUSE: "What do you think is causing the breathing problem?"      Unsure 9. OTHER SYMPTOMS: "Do you have any other symptoms? (e.g., dizziness, runny nose, cough, chest pain, fever)     No 10. O2 SATURATION MONITOR:  "Do you use an oxygen saturation monitor (pulse oximeter) at home?" If Yes, ask: "What is your reading (oxygen level) today?" "What is your usual oxygen saturation reading?" (e.g., 95%)       No 11. PREGNANCY: "Is there any chance you are pregnant?" "When was your last menstrual period?"       N/a 12. TRAVEL: "Have you traveled out of the country in the last month?" (e.g., travel history, exposures)       No  Protocols used: Breathing Difficulty-A-AH

## 2023-02-15 LAB — BRAIN NATRIURETIC PEPTIDE: BNP: 6.4 pg/mL (ref 0.0–100.0)

## 2023-02-17 ENCOUNTER — Other Ambulatory Visit: Payer: Self-pay

## 2023-02-17 MED ORDER — TETANUS-DIPHTH-ACELL PERTUSSIS 5-2-15.5 LF-MCG/0.5 IM SUSP
0.5000 mL | Freq: Once | INTRAMUSCULAR | 0 refills | Status: AC
  Filled 2023-02-17 – 2023-02-26 (×2): qty 0.5, 1d supply, fill #0

## 2023-02-21 ENCOUNTER — Other Ambulatory Visit: Payer: Self-pay

## 2023-02-26 ENCOUNTER — Other Ambulatory Visit: Payer: Self-pay

## 2023-02-26 ENCOUNTER — Ambulatory Visit: Payer: Self-pay | Attending: Cardiology

## 2023-02-26 DIAGNOSIS — I442 Atrioventricular block, complete: Secondary | ICD-10-CM

## 2023-02-26 LAB — CUP PACEART INCLINIC DEVICE CHECK
Battery Remaining Longevity: 106 mo
Battery Voltage: 3.05 V
Brady Statistic RA Percent Paced: 33 %
Brady Statistic RV Percent Paced: 99.99 %
Date Time Interrogation Session: 20241030122210
Implantable Lead Connection Status: 753985
Implantable Lead Connection Status: 753985
Implantable Lead Implant Date: 20241015
Implantable Lead Implant Date: 20241015
Implantable Lead Location: 753859
Implantable Lead Location: 753860
Implantable Pulse Generator Implant Date: 20241015
Lead Channel Impedance Value: 450 Ohm
Lead Channel Impedance Value: 550 Ohm
Lead Channel Pacing Threshold Amplitude: 0.5 V
Lead Channel Pacing Threshold Amplitude: 0.5 V
Lead Channel Pacing Threshold Amplitude: 0.75 V
Lead Channel Pacing Threshold Amplitude: 0.75 V
Lead Channel Pacing Threshold Pulse Width: 0.5 ms
Lead Channel Pacing Threshold Pulse Width: 0.5 ms
Lead Channel Pacing Threshold Pulse Width: 0.5 ms
Lead Channel Pacing Threshold Pulse Width: 0.5 ms
Lead Channel Sensing Intrinsic Amplitude: 5 mV
Lead Channel Setting Pacing Amplitude: 0.875
Lead Channel Setting Pacing Amplitude: 3.5 V
Lead Channel Setting Pacing Pulse Width: 0.5 ms
Lead Channel Setting Sensing Sensitivity: 4 mV
Pulse Gen Model: 2272
Pulse Gen Serial Number: 8219145

## 2023-02-26 NOTE — Progress Notes (Signed)

## 2023-02-26 NOTE — Patient Instructions (Signed)
   After Your Pacemaker   Monitor your pacemaker site for redness, swelling, and drainage. Call the device clinic at [phone] if you experience these symptoms or fever/chills.  Your incision was closed with Steri-strips or staples:  You may shower 7 days after your procedure and wash your incision with soap and water. Avoid lotions, ointments, or perfumes over your incision until it is well-healed.  You may use a hot tub or a pool after your wound check appointment if the incision is completely closed.  Do not lift, push or pull greater than 10 pounds with the affected arm until 4 weeks after your procedure. There are no other restrictions in arm movement after your wound check appointment.  You may drive, unless driving has been restricted by your healthcare providers.  Remote monitoring is used to monitor your pacemaker from home. This monitoring is scheduled every 91 days by our office. It allows [place] to keep an eye on the functioning of your device to ensure it is working properly. You will routinely see your Electrophysiologist annually (more often if necessary).

## 2023-03-07 ENCOUNTER — Other Ambulatory Visit: Payer: Self-pay

## 2023-03-10 ENCOUNTER — Other Ambulatory Visit: Payer: Self-pay

## 2023-03-11 ENCOUNTER — Other Ambulatory Visit: Payer: Self-pay

## 2023-03-24 ENCOUNTER — Encounter: Payer: Self-pay | Admitting: Cardiology

## 2023-03-25 NOTE — Telephone Encounter (Signed)
Left message to call back to discuss.

## 2023-03-26 ENCOUNTER — Telehealth: Payer: Self-pay | Admitting: Cardiology

## 2023-03-26 NOTE — Telephone Encounter (Signed)
Reviewed discharge instructions following pacemaker impant--patient is clear to return to work today (6 weeks after procedure).  Informed patient, he verbalized understanding and expressed appreciation for call.

## 2023-03-26 NOTE — Telephone Encounter (Signed)
Pt wants to know when he can be release to back to work

## 2023-04-14 ENCOUNTER — Other Ambulatory Visit: Payer: Self-pay

## 2023-04-16 ENCOUNTER — Other Ambulatory Visit: Payer: Self-pay

## 2023-04-29 ENCOUNTER — Telehealth: Payer: Self-pay | Admitting: *Deleted

## 2023-04-29 NOTE — Telephone Encounter (Signed)
 Lmovm to verify card hx.

## 2023-05-06 ENCOUNTER — Encounter: Payer: Self-pay | Admitting: Cardiovascular Disease

## 2023-05-06 ENCOUNTER — Ambulatory Visit: Payer: Self-pay | Attending: Cardiovascular Disease | Admitting: Cardiovascular Disease

## 2023-05-06 VITALS — BP 140/80 | HR 77 | Ht 67.0 in | Wt 194.2 lb

## 2023-05-06 DIAGNOSIS — Z950 Presence of cardiac pacemaker: Secondary | ICD-10-CM

## 2023-05-06 DIAGNOSIS — I10 Essential (primary) hypertension: Secondary | ICD-10-CM

## 2023-05-06 DIAGNOSIS — E785 Hyperlipidemia, unspecified: Secondary | ICD-10-CM

## 2023-05-06 DIAGNOSIS — R0789 Other chest pain: Secondary | ICD-10-CM

## 2023-05-06 NOTE — Progress Notes (Signed)
 Cardiology Office Note   Date:  05/08/2023   ID:  Jaime Moore, DOB [birth year], MRN [id number]  PCP:  Vicci Barnie NOVAK, MD  Cardiologist:   Deatrice Cage, MD   Chief Complaint  Patient presents with   Follow-up    F/u chest pain. Meds reviewed verbally with pt.      History of Present Illness: Jaime Moore is a 53 y.o. male who was referred by Dr. Vicci for evaluation of chest pain. He has known history of essential hypertension, hyperlipidemia and obesity.  He presented in October with dizziness and was found to have complete heart block.  He was transferred to [facility].  He underwent cardiac MRI which was unremarkable.  Lyme disease serology was negative.  He underwent dual-chamber pacemaker placement without complications. Echocardiogram showed hyperdynamic LV systolic function with no significant valvular abnormalities. He recently had worsening chest pain described as burning sensation substernally radiating to his throat.  This was mostly in the setting of certain types of food and not exertional.  He was placed on omeprazole  with significant improvement in symptoms. He is not a smoker and does not drink alcohol.  His brother died in his 57s of myocardial infarction. He works at the psychologist, occupational in [place].   Past Medical History:  Diagnosis Date   Hypertension     Past Surgical History:  Procedure Laterality Date   PACEMAKER IMPLANT N/A 02/11/2023   Procedure: PACEMAKER IMPLANT;  Surgeon: Inocencio Soyla Lunger, MD;  Location: [facility];  Service: Cardiovascular;  Laterality: N/A;     Current Outpatient Medications  Medication Sig Dispense Refill   amLODipine  (NORVASC ) 5 MG tablet Take 1 tablet (5 mg total) by mouth daily. 30 tablet 2   aspirin  EC 81 MG tablet Take 1 tablet (81 mg total) by mouth daily. Swallow whole.     omeprazole  (PRILOSEC) 20 MG capsule Take 1 capsule (20 mg total) by mouth daily. 90 capsule 1   No current  facility-administered medications for this visit.    Allergies:   Patient has no known allergies.    Social History:  The patient  reports that he has never smoked. He has never used smokeless tobacco. He reports that he does not drink alcohol and does not use drugs.   Family History:  The patient's family history includes Cancer in his mother; Cancer - Other in an other family member; Heart attack in his brother; Heart disease in his brother and maternal grandmother; Hyperlipidemia in an other family member.    ROS:  Please see the history of present illness.   Otherwise, review of systems are positive for none.   All other systems are reviewed and negative.    PHYSICAL EXAM: VS:  BP (!) 140/80 (BP Location: Right Arm, Cuff Size: Normal)   Pulse 77   Ht 5' 7 (1.702 m)   Wt 194 lb 4 oz (88.1 kg)   SpO2 99%   BMI 30.42 kg/m  , BMI Body mass index is 30.42 kg/m. GEN: Well nourished, well developed, in no acute distress  HEENT: normal  Neck: no JVD, carotid bruits, or masses Cardiac: RRR; no murmurs, rubs, or gallops,no edema  Respiratory:  clear to auscultation bilaterally, normal work of breathing GI: soft, nontender, nondistended, + BS MS: no deformity or atrophy  Skin: warm and dry, no rash Neuro:  Strength and sensation are intact Psych: euthymic mood, full affect   EKG:  EKG is ordered today. The ekg  ordered today demonstrates : Normal sinus rhythm Rightward axis Non-specific intra-ventricular conduction block Cannot rule out Anteroseptal infarct , age undetermined When compared with ECG of 14-Feb-2023 15:42, Sinus rhythm has replaced Electronic ventricular pacemaker    Recent Labs: 02/09/2023: TSH 1.884 02/11/2023: BUN 16; Creatinine, Ser 1.15; Hemoglobin 14.0; Magnesium 2.3; Platelets 258; Potassium 3.9; Sodium 140 02/14/2023: BNP 6.4    Lipid Panel    Component Value Date/Time   CHOL 203 (H) 10/07/2018 0653   TRIG 85 10/07/2018 0653   HDL 38 (L)  10/07/2018 0653   CHOLHDL 5.3 10/07/2018 0653   VLDL 17 10/07/2018 0653   LDLCALC 148 (H) 10/07/2018 0653      Wt Readings from Last 3 Encounters:  05/06/23 194 lb 4 oz (88.1 kg)  02/14/23 198 lb 9.6 oz (90.1 kg)  02/11/23 198 lb 9.6 oz (90.1 kg)          05/06/2023    2:03 PM  PAD Screen  Previous PAD dx? No  Previous surgical procedure? No  Pain with walking? Yes  Subsides with rest? Yes  Feet/toe relief with dangling? Yes  Painful, non-healing ulcers? No  Extremities discolored? No      ASSESSMENT AND PLAN:  1.  Noncardiac chest pain due to GERD.  His symptoms are highly suggestive of GERD and it responded extremely well to omeprazole  and improved diet.  He is mostly asymptomatic at the present time.  Thus, it is unlikely that his symptoms were related to obstructive coronary artery disease. Nonetheless, he does have multiple risk factors for coronary artery disease and has untreated hyperlipidemia.  I discussed with him the option of proceeding with coronary calcium score for risk stratification and he is agreeable.  This will help us  determine how aggressive to be with his hyperlipidemia.  2.  Essential hypertension: Blood pressure is reasonably controlled on amlodipine .  3.  Hyperlipidemia: Previous LDL was around 150 but no recent lipid profile.  4.  Recent complete heart block status post dual-chamber pacemaker placement.  Continue follow-up with EP.    Disposition:   FU with me as needed if calcium score is abnormal.  Signed,  Deatrice Cage, MD  05/08/2023 10:59 AM    [facility]

## 2023-05-06 NOTE — Patient Instructions (Signed)
 Medication Instructions:  No changes *If you need a refill on your cardiac medications before your next appointment, please call your pharmacy*   Lab Work: None ordered If you have labs (blood work) drawn today and your tests are completely normal, you will receive your results only by: MyChart Message (if you have MyChart) OR A paper copy in the mail If you have any lab test that is abnormal or we need to change your treatment, we will call you to review the results.   Testing/Procedures: None ordered   Follow-Up: At [facility], you and your health needs are our priority.  As part of our continuing mission to provide you with exceptional heart care, we have created designated Provider Care Teams.  These Care Teams include your primary Cardiologist (physician) and Advanced Practice Providers (APPs -  Physician Assistants and Nurse Practitioners) who all work together to provide you with the care you need, when you need it.  We recommend signing up for the patient portal called MyChart.  Sign up information is provided on this After Visit Summary.  MyChart is used to connect with patients for Virtual Visits (Telemedicine).  Patients are able to view lab/test results, encounter notes, upcoming appointments, etc.  Non-urgent messages can be sent to your provider as well.   To learn more about what you can do with MyChart, go to [email or web address].    Your next appointment:   Follow up as needed Other Instructions Your physician has recommended that you have CT Coronary Calcium Score.  - $99 out of pocket cost at the time of your test - Call [phone] to schedule at your convenience.  [facility] [address] [place], [place] [address]   Coronary CalciumScan A coronary calcium scan is an imaging test used to look for deposits of calcium and other fatty materials (plaques) in the inner lining of the blood vessels of the  heart (coronary arteries). These deposits of calcium and plaques can partly clog and narrow the coronary arteries without producing any symptoms or warning signs. This puts a person at risk for a heart attack. This test can detect these deposits before symptoms develop. Tell a health care provider about: Any allergies you have. All medicines you are taking, including vitamins, herbs, eye drops, creams, and over-the-counter medicines. Any problems you or family members have had with anesthetic medicines. Any blood disorders you have. Any surgeries you have had. Any medical conditions you have. Whether you are pregnant or may be pregnant. What are the risks? Generally, this is a safe procedure. However, problems may occur, including: Harm to a pregnant woman and her unborn baby. This test involves the use of radiation. Radiation exposure can be dangerous to a pregnant woman and her unborn baby. If you are pregnant, you generally should not have this procedure done. Slight increase in the risk of cancer. This is because of the radiation involved in the test. What happens before the procedure? No preparation is needed for this procedure. What happens during the procedure? You will undress and remove any jewelry around your neck or chest. You will put on a hospital gown. Sticky electrodes will be placed on your chest. The electrodes will be connected to an electrocardiogram (ECG) machine to record a tracing of the electrical activity of your heart. A CT scanner will take pictures of your heart. During this time, you will be asked to lie still and hold your breath for 2-3 seconds while a  picture of your heart is being taken. The procedure may vary among health care providers and hospitals. What happens after the procedure? You can get dressed. You can return to your normal activities. It is up to you to get the results of your test. Ask your health care provider, or the department that is doing the  test, when your results will be ready. Summary A coronary calcium scan is an imaging test used to look for deposits of calcium and other fatty materials (plaques) in the inner lining of the blood vessels of the heart (coronary arteries). Generally, this is a safe procedure. Tell your health care provider if you are pregnant or may be pregnant. No preparation is needed for this procedure. A CT scanner will take pictures of your heart. You can return to your normal activities after the scan is done. This information is not intended to replace advice given to you by your health care provider. Make sure you discuss any questions you have with your health care provider. Document Released: 10/12/2007 Document Revised: 03/04/2016 Document Reviewed: 03/04/2016 Elsevier Interactive Patient Education  2017 [employer].

## 2023-05-12 ENCOUNTER — Other Ambulatory Visit: Payer: Self-pay

## 2023-05-13 ENCOUNTER — Other Ambulatory Visit: Payer: Self-pay

## 2023-05-13 ENCOUNTER — Other Ambulatory Visit: Payer: Self-pay | Admitting: Internal Medicine

## 2023-05-13 DIAGNOSIS — I10 Essential (primary) hypertension: Secondary | ICD-10-CM

## 2023-05-13 MED ORDER — AMLODIPINE BESYLATE 5 MG PO TABS
5.0000 mg | ORAL_TABLET | Freq: Every day | ORAL | 0 refills | Status: DC
  Filled 2023-05-13: qty 30, 30d supply, fill #0

## 2023-05-14 ENCOUNTER — Other Ambulatory Visit: Payer: Self-pay

## 2023-05-14 ENCOUNTER — Ambulatory Visit (INDEPENDENT_AMBULATORY_CARE_PROVIDER_SITE_OTHER): Payer: Self-pay

## 2023-05-14 DIAGNOSIS — I442 Atrioventricular block, complete: Secondary | ICD-10-CM

## 2023-05-14 LAB — CUP PACEART REMOTE DEVICE CHECK
Battery Remaining Longevity: 92 mo
Battery Remaining Percentage: 95.5 %
Battery Voltage: 3.01 V
Brady Statistic AP VP Percent: 25 %
Brady Statistic AP VS Percent: 1 %
Brady Statistic AS VP Percent: 75 %
Brady Statistic AS VS Percent: 1 %
Brady Statistic RA Percent Paced: 25 %
Brady Statistic RV Percent Paced: 99 %
Date Time Interrogation Session: 20250115054304
Implantable Lead Connection Status: 753985
Implantable Lead Connection Status: 753985
Implantable Lead Implant Date: 20241015
Implantable Lead Implant Date: 20241015
Implantable Lead Location: 753859
Implantable Lead Location: 753860
Implantable Pulse Generator Implant Date: 20241015
Lead Channel Impedance Value: 410 Ohm
Lead Channel Impedance Value: 530 Ohm
Lead Channel Pacing Threshold Amplitude: 0.5 V
Lead Channel Pacing Threshold Amplitude: 0.75 V
Lead Channel Pacing Threshold Pulse Width: 0.5 ms
Lead Channel Pacing Threshold Pulse Width: 0.5 ms
Lead Channel Sensing Intrinsic Amplitude: 5 mV
Lead Channel Setting Pacing Amplitude: 1 V
Lead Channel Setting Pacing Amplitude: 3.5 V
Lead Channel Setting Pacing Pulse Width: 0.5 ms
Lead Channel Setting Sensing Sensitivity: 4 mV
Pulse Gen Model: 2272
Pulse Gen Serial Number: 8219145

## 2023-05-20 ENCOUNTER — Ambulatory Visit: Payer: Self-pay | Attending: Cardiology | Admitting: Cardiology

## 2023-05-20 ENCOUNTER — Encounter: Payer: Self-pay | Admitting: Cardiology

## 2023-05-20 VITALS — BP 140/86 | HR 81 | Ht 67.0 in | Wt 192.4 lb

## 2023-05-20 DIAGNOSIS — I10 Essential (primary) hypertension: Secondary | ICD-10-CM

## 2023-05-20 DIAGNOSIS — I442 Atrioventricular block, complete: Secondary | ICD-10-CM

## 2023-05-20 LAB — CUP PACEART INCLINIC DEVICE CHECK
Battery Remaining Longevity: 120 mo
Battery Voltage: 2.99 V
Brady Statistic RA Percent Paced: 25 %
Brady Statistic RV Percent Paced: 99.99 %
Date Time Interrogation Session: 20250121123754
Implantable Lead Connection Status: 753985
Implantable Lead Connection Status: 753985
Implantable Lead Implant Date: 20241015
Implantable Lead Implant Date: 20241015
Implantable Lead Location: 753859
Implantable Lead Location: 753860
Implantable Pulse Generator Implant Date: 20241015
Lead Channel Pacing Threshold Amplitude: 0.5 V
Lead Channel Pacing Threshold Amplitude: 0.625 V
Lead Channel Pacing Threshold Amplitude: 0.75 V
Lead Channel Pacing Threshold Pulse Width: 0.5 ms
Lead Channel Pacing Threshold Pulse Width: 0.5 ms
Lead Channel Pacing Threshold Pulse Width: 0.5 ms
Lead Channel Sensing Intrinsic Amplitude: 5 mV
Lead Channel Setting Pacing Amplitude: 0.875
Lead Channel Setting Pacing Amplitude: 2 V
Lead Channel Setting Pacing Pulse Width: 0.5 ms
Lead Channel Setting Sensing Sensitivity: 4 mV
Pulse Gen Model: 2272
Pulse Gen Serial Number: 8219145

## 2023-05-20 NOTE — Progress Notes (Signed)
  Electrophysiology Office Note:   Date:  05/20/2023  ID:  Jaime Moore, DOB 1970-07-15, MRN [id number]  Primary Cardiologist: Lorine Bears, MD Primary Heart Failure: None Electrophysiologist: Kessa Fairbairn Jorja Loa, MD      History of Present Illness:   Jaime Moore is a 53 y.o. male with h/o pretension, hyperlipidemia, obesity, complete heart block seen today for routine electrophysiology followup.   Since last being seen in our clinic the patient reports doing well.  He feels much improved since his pacemaker was implanted with quite a bit less fatigue and shortness of breath.  Now able to do his daily activities.  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  he denies chest pain, palpitations, dyspnea, PND, orthopnea, nausea, vomiting, dizziness, syncope, edema, weight gain, or early satiety.   Review of systems complete and found to be negative unless listed in HPI.      EP Information / Studies Reviewed:    EKG is not ordered today. EKG from 05/06/23 reviewed which showed A sense, V pace      PPM Interrogation-  reviewed in detail today,  See PACEART report.  Device History: Abbott Dual Chamber PPM implanted 02/11/23 for CHB  Risk Assessment/Calculations:             Physical Exam:   VS:  BP (!) 140/86 (BP Location: Left Arm, Patient Position: Sitting, Cuff Size: Large)   Pulse 81   Ht 5\' 7"  (1.702 m)   Wt 192 lb 6.4 oz (87.3 kg)   SpO2 97%   BMI 30.13 kg/m    Wt Readings from Last 3 Encounters:  05/20/23 192 lb 6.4 oz (87.3 kg)  05/06/23 194 lb 4 oz (88.1 kg)  02/14/23 198 lb 9.6 oz (90.1 kg)     GEN: Well nourished, well developed in no acute distress NECK: No JVD; No carotid bruits CARDIAC: Regular rate and rhythm, no murmurs, rubs, gallops RESPIRATORY:  Clear to auscultation without rales, wheezing or rhonchi  ABDOMEN: Soft, non-tender, non-distended EXTREMITIES:  No edema; No deformity   ASSESSMENT AND PLAN:    CHB s/p Abbott PPM  Normal PPM  function See Pace Art report No changes today  2.  Hypertension: Elevated today.  Plan per primary physician  Disposition:   Follow up with EP APP in 12 months  Signed, Johnte Portnoy Jorja Loa, MD

## 2023-05-20 NOTE — Patient Instructions (Signed)
Medication Instructions:  Your physician recommends that you continue on your current medications as directed. Please refer to the Current Medication list given to you today.  *If you need a refill on your cardiac medications before your next appointment, please call your pharmacy*   Lab Work: None ordered.  If you have labs (blood work) drawn today and your tests are completely normal, you will receive your results only by: MyChart Message (if you have MyChart) OR A paper copy in the mail If you have any lab test that is abnormal or we need to change your treatment, we will call you to review the results.   Testing/Procedures: None ordered.    Follow-Up: At [facility], you and your health needs are our priority.  As part of our continuing mission to provide you with exceptional heart care, we have created designated Provider Care Teams.  These Care Teams include your primary Cardiologist (physician) and Advanced Practice Providers (APPs -  Physician Assistants and Nurse Practitioners) who all work together to provide you with the care you need, when you need it.  We recommend signing up for the patient portal called "MyChart".  Sign up information is provided on this After Visit Summary.  MyChart is used to connect with patients for Virtual Visits (Telemedicine).  Patients are able to view lab/test results, encounter notes, upcoming appointments, etc.  Non-urgent messages can be sent to your provider as well.   To learn more about what you can do with MyChart, go to [email or web address].    Your next appointment:   12 months with Dr Elberta Fortis PA

## 2023-06-08 ENCOUNTER — Other Ambulatory Visit: Payer: Self-pay | Admitting: Internal Medicine

## 2023-06-08 DIAGNOSIS — I10 Essential (primary) hypertension: Secondary | ICD-10-CM

## 2023-06-08 DIAGNOSIS — R079 Chest pain, unspecified: Secondary | ICD-10-CM

## 2023-06-09 ENCOUNTER — Other Ambulatory Visit: Payer: Self-pay

## 2023-06-09 MED ORDER — AMLODIPINE BESYLATE 5 MG PO TABS
5.0000 mg | ORAL_TABLET | Freq: Every day | ORAL | 0 refills | Status: DC
  Filled 2023-06-09: qty 30, 30d supply, fill #0

## 2023-06-09 MED ORDER — ASPIRIN 81 MG PO TBEC: 81.0000 mg | DELAYED_RELEASE_TABLET | Freq: Every day | ORAL | 1 refills | Status: AC

## 2023-06-09 MED ORDER — OMEPRAZOLE 20 MG PO CPDR
20.0000 mg | DELAYED_RELEASE_CAPSULE | Freq: Every day | ORAL | 0 refills | Status: DC
  Filled 2023-06-09: qty 30, 30d supply, fill #0

## 2023-06-13 ENCOUNTER — Other Ambulatory Visit: Payer: Self-pay

## 2023-06-20 ENCOUNTER — Encounter: Payer: Self-pay | Admitting: Internal Medicine

## 2023-06-20 ENCOUNTER — Other Ambulatory Visit: Payer: Self-pay

## 2023-06-20 ENCOUNTER — Ambulatory Visit: Payer: Self-pay | Attending: Internal Medicine | Admitting: Internal Medicine

## 2023-06-20 VITALS — BP 156/81 | HR 86 | Temp 98.0°F | Ht 67.0 in | Wt 190.0 lb

## 2023-06-20 DIAGNOSIS — Z1211 Encounter for screening for malignant neoplasm of colon: Secondary | ICD-10-CM

## 2023-06-20 DIAGNOSIS — F418 Other specified anxiety disorders: Secondary | ICD-10-CM

## 2023-06-20 DIAGNOSIS — F330 Major depressive disorder, recurrent, mild: Secondary | ICD-10-CM

## 2023-06-20 DIAGNOSIS — I10 Essential (primary) hypertension: Secondary | ICD-10-CM

## 2023-06-20 DIAGNOSIS — K219 Gastro-esophageal reflux disease without esophagitis: Secondary | ICD-10-CM

## 2023-06-20 MED ORDER — OMEPRAZOLE 20 MG PO CPDR
20.0000 mg | DELAYED_RELEASE_CAPSULE | Freq: Every day | ORAL | 1 refills | Status: DC
Start: 2023-06-20 — End: 2024-03-23
  Filled 2023-06-20 – 2023-09-10 (×2): qty 90, 90d supply, fill #0
  Filled 2023-12-04 – 2023-12-27 (×2): qty 90, 90d supply, fill #1
  Filled ????-??-??: fill #0

## 2023-06-20 MED ORDER — AMLODIPINE BESYLATE 10 MG PO TABS
10.0000 mg | ORAL_TABLET | Freq: Every day | ORAL | 1 refills | Status: DC
Start: 2023-06-20 — End: 2023-12-27
  Filled 2023-06-20 – 2023-07-02 (×2): qty 90, 90d supply, fill #0
  Filled 2023-09-25: qty 90, 90d supply, fill #1

## 2023-06-20 NOTE — Patient Instructions (Signed)
 Increase Amlodipine to 10 mg daily.  Continue to check blood pressure at least 2x a week with goal being 130/80 or lower.  Please use the stool kit and return it to the lab the same day that you use it.  Do not mail it in.

## 2023-06-20 NOTE — Progress Notes (Signed)
 Patient ID: Jaime Moore, male    DOB: 07-20-1970  MRN: [id number]  CC: Hypertension (HTN f/u. Med refill. / No questions / concerns/No to flu vax)   Subjective: Jaime Moore is a 53 y.o. male who presents for chronic ds management. Debbie his GF is with him.  His concerns today include:  Patient with history of HTN, HL, CKD 3, tobacco dependence, GERD, history of complete heart block/pacemaker present  Discussed the use of AI scribe software for clinical note transcription with the patient, who gave verbal consent to proceed.  History of Present Illness   The patient, with a history of heart block and hypertension, presents for follow-up. He was previously experiencing chest pains, which were suspected to be due to acid reflux. He was started on omeprazole and referred to a cardiologist. The patient reports that the chest pain has resolved with omeprazole, but he still experiences heartburn sometimes with certain foods. He is trying to avoid trigger foods such as spicy foods, citrus fruits.  HTN:  He has been taking amlodipine 5 mg for hypertension and reports that his blood pressure readings at home range from 140 to 180. He checks his blood pressure once or twice a week and is trying to limit his salt intake.  The patient completed a depression and anxiety screen, with scores lower than his previous visit. He does not feel that he needs counseling or medication for these issues at this time.   HM:  He has not had any form of colon cancer screening in the past and does not have insurance. He has not applied for Medicaid as yet as advised on last visit.      Patient Active Problem List   Diagnosis Date Noted   Essential hypertension 02/14/2023   Major depressive disorder, single episode, mild (HCC) 02/14/2023   Situational anxiety 02/14/2023   Complete heart block (HCC) 02/10/2023   Dizziness 10/06/2018     Current Outpatient Medications on File Prior to Visit  Medication Sig  Dispense Refill   aspirin EC 81 MG tablet Take 1 tablet (81 mg total) by mouth daily. Swallow whole. 90 tablet 1   No current facility-administered medications on file prior to visit.    No Known Allergies  Social History   Socioeconomic History   Marital status: Single    Spouse name: Not on file   Number of children: Not on file   Years of education: Not on file   Highest education level: Not on file  Occupational History   Occupation: Primary school teacher  Tobacco Use   Smoking status: Never   Smokeless tobacco: Never  Vaping Use   Vaping status: Never Used  Substance and Sexual Activity   Alcohol use: No   Drug use: No   Sexual activity: Not on file  Other Topics Concern   Not on file  Social History Narrative   Not on file   Social Drivers of Health   Financial Resource Strain: Low Risk  (06/20/2023)   Overall Financial Resource Strain (CARDIA)    Difficulty of Paying Living Expenses: Not very hard  Food Insecurity: No Food Insecurity (06/20/2023)   Hunger Vital Sign    Worried About Running Out of Food in the Last Year: Never true    Ran Out of Food in the Last Year: Never true  Transportation Needs: No Transportation Needs (06/20/2023)   PRAPARE - Administrator, Civil Service (Medical): No    Lack of Transportation (Non-Medical):  No  Physical Activity: Insufficiently Active (06/20/2023)   Exercise Vital Sign    Days of Exercise per Week: 5 days    Minutes of Exercise per Session: 20 min  Stress: Stress Concern Present (06/20/2023)   [employer] of Occupational Health - Occupational Stress Questionnaire    Feeling of Stress : To some extent  Social Connections: Socially Isolated (06/20/2023)   Social Connection and Isolation Panel [NHANES]    Frequency of Communication with Friends and Family: Three times a week    Frequency of Social Gatherings with Friends and Family: More than three times a week    Attends Religious Services: Never    Loss adjuster, chartered or Organizations: No    Attends Banker Meetings: Never    Marital Status: Never married  Intimate Partner Violence: Not At Risk (06/20/2023)   Humiliation, Afraid, Rape, and Kick questionnaire    Fear of Current or Ex-Partner: No    Emotionally Abused: No    Physically Abused: No    Sexually Abused: No    Family History  Problem Relation Age of Onset   Cancer Mother        lung CA   Heart attack Brother    Heart disease Brother    Heart disease Maternal Grandmother    Cancer - Other Other        lung CA   Hyperlipidemia Other     Past Surgical History:  Procedure Laterality Date   PACEMAKER IMPLANT N/A 02/11/2023   Procedure: PACEMAKER IMPLANT;  Surgeon: Regan Lemming, MD;  Location: [facility];  Service: Cardiovascular;  Laterality: N/A;    ROS: Review of Systems Negative except as stated above  PHYSICAL EXAM: BP (!) 156/81 (BP Location: Left Arm, Patient Position: Sitting, Cuff Size: Normal)   Pulse 86   Temp 98 F (36.7 C) (Oral)   Ht 5\' 7"  (1.702 m)   Wt 190 lb (86.2 kg)   SpO2 97%   BMI 29.76 kg/m   Physical Exam   General appearance - alert, well appearing, and in no distress Mental status - normal mood, behavior, speech, dress, motor activity, and thought processes Neck - supple, no significant adenopathy Chest - clear to auscultation, no wheezes, rales or rhonchi, symmetric air entry Heart - normal rate, regular rhythm, normal S1, S2, no murmurs, rubs, clicks or gallops Extremities - peripheral pulses normal, no pedal edema, no clubbing or cyanosis     06/20/2023    4:42 PM 02/14/2023    2:27 PM  Depression screen PHQ 2/9  Decreased Interest 1 1  Down, Depressed, Hopeless 1 2  PHQ - 2 Score 2 3  Altered sleeping 3 3  Tired, decreased energy 1 2  Change in appetite 0 2  Feeling bad or failure about yourself  0 1  Trouble concentrating 1 3  Moving slowly or fidgety/restless 1 3  Suicidal thoughts 0 0  PHQ-9  Score 8 17  Difficult doing work/chores Somewhat difficult Extremely dIfficult      06/20/2023    4:42 PM 02/14/2023    2:27 PM  GAD 7 : Generalized Anxiety Score  Nervous, Anxious, on Edge 1 3  Control/stop worrying 2 3  Worry too much - different things 2 3  Trouble relaxing 3 3  Restless 1 3  Easily annoyed or irritable 3 3  Afraid - awful might happen 1 3  Total GAD 7 Score 13 21  Anxiety Difficulty Somewhat difficult Extremely  difficult        Latest Ref Rng & Units 02/11/2023    4:07 AM 02/09/2023    2:36 PM 10/06/2018   10:52 AM  CMP  Glucose 70 - 99 mg/dL 97  161    BUN 6 - 20 mg/dL 16  16    Creatinine 0.96 - 1.24 mg/dL 0.45  4.09  8.11   Sodium 135 - 145 mmol/L 140  138    Potassium 3.5 - 5.1 mmol/L 3.9  3.6    Chloride 98 - 111 mmol/L 104  103    CO2 22 - 32 mmol/L 24  23    Calcium 8.9 - 10.3 mg/dL 9.4  9.4     Lipid Panel     Component Value Date/Time   CHOL 203 (H) 10/07/2018 0653   TRIG 85 10/07/2018 0653   HDL 38 (L) 10/07/2018 0653   CHOLHDL 5.3 10/07/2018 0653   VLDL 17 10/07/2018 0653   LDLCALC 148 (H) 10/07/2018 0653    CBC    Component Value Date/Time   WBC 12.0 (H) 02/11/2023 0407   RBC 4.41 02/11/2023 0407   HGB 14.0 02/11/2023 0407   HCT 41.2 02/11/2023 0407   PLT 258 02/11/2023 0407   MCV 93.4 02/11/2023 0407   MCH 31.7 02/11/2023 0407   MCHC 34.0 02/11/2023 0407   RDW 12.2 02/11/2023 0407   LYMPHSABS 3.0 10/05/2018 2238   MONOABS 0.9 10/05/2018 2238   EOSABS 0.1 10/05/2018 2238   BASOSABS 0.1 10/05/2018 2238    ASSESSMENT AND PLAN: 1. Essential hypertension (Primary) Not at goal.  Increase amlodipine to 10 mg daily.  Continue to monitor blood pressure with goal being 130/80 or lower. - amLODipine (NORVASC) 10 MG tablet; Take 1 tablet (10 mg total) by mouth daily.  Dispense: 90 tablet; Refill: 1  2. Gastroesophageal reflux disease without esophagitis Significantly improved and chest pain resolved suggesting it was GI in  nature.  Continue omeprazole.  Reminded to avoid certain foods like spicy foods, juices, and tomato-based foods as these can trigger heartburn. - omeprazole (PRILOSEC) 20 MG capsule; Take 1 capsule (20 mg total) by mouth daily.  Dispense: 90 capsule; Refill: 1  3. Situational anxiety 4. Major depressive disorder, recurrent episode, mild (HCC) GAD-7 and PHQ-9 scores today significantly improved.  Patient feels he is better.  Does not feel that he needs any counseling or medication  5. Screening for colon cancer Discussed colon cancer screening methods.  Since he is uninsured, he is agreeable to doing the fit test. - Fecal occult blood, imunochemical([facility])  Encouraged him to apply for Medicaid.      Patient was given the opportunity to ask questions.  Patient verbalized understanding of the plan and was able to repeat key elements of the plan.   This documentation was completed using Paediatric nurse.  Any transcriptional errors are unintentional.  Orders Placed This Encounter  Procedures   Fecal occult blood, imunochemical([facility])     Requested Prescriptions   Signed Prescriptions Disp Refills   amLODipine (NORVASC) 10 MG tablet 90 tablet 1    Sig: Take 1 tablet (10 mg total) by mouth daily.   omeprazole (PRILOSEC) 20 MG capsule 90 capsule 1    Sig: Take 1 capsule (20 mg total) by mouth daily.    No follow-ups on file.  Jonah Blue, MD, FACP

## 2023-06-24 NOTE — Progress Notes (Signed)
 Remote pacemaker transmission.

## 2023-07-01 ENCOUNTER — Other Ambulatory Visit: Payer: Self-pay

## 2023-07-02 ENCOUNTER — Other Ambulatory Visit: Payer: Self-pay

## 2023-09-10 ENCOUNTER — Other Ambulatory Visit: Payer: Self-pay

## 2023-09-11 ENCOUNTER — Ambulatory Visit: Payer: Self-pay | Admitting: Internal Medicine

## 2023-09-11 LAB — FECAL OCCULT BLOOD, IMMUNOCHEMICAL: Fecal Occult Bld: NEGATIVE

## 2023-09-19 ENCOUNTER — Ambulatory Visit (INDEPENDENT_AMBULATORY_CARE_PROVIDER_SITE_OTHER): Payer: Self-pay

## 2023-09-19 DIAGNOSIS — I442 Atrioventricular block, complete: Secondary | ICD-10-CM

## 2023-09-19 LAB — CUP PACEART REMOTE DEVICE CHECK
Battery Remaining Longevity: 112 mo
Battery Remaining Percentage: 95.5 %
Battery Voltage: 3.01 V
Brady Statistic AP VP Percent: 30 %
Brady Statistic AP VS Percent: 1 %
Brady Statistic AS VP Percent: 70 %
Brady Statistic AS VS Percent: 1 %
Brady Statistic RA Percent Paced: 30 %
Brady Statistic RV Percent Paced: 99 %
Date Time Interrogation Session: 20250522192311
Implantable Lead Connection Status: 753985
Implantable Lead Connection Status: 753985
Implantable Lead Implant Date: 20241015
Implantable Lead Implant Date: 20241015
Implantable Lead Location: 753859
Implantable Lead Location: 753860
Implantable Pulse Generator Implant Date: 20241015
Lead Channel Impedance Value: 430 Ohm
Lead Channel Impedance Value: 550 Ohm
Lead Channel Pacing Threshold Amplitude: 0.625 V
Lead Channel Pacing Threshold Amplitude: 0.875 V
Lead Channel Pacing Threshold Pulse Width: 0.5 ms
Lead Channel Pacing Threshold Pulse Width: 0.5 ms
Lead Channel Sensing Intrinsic Amplitude: 5 mV
Lead Channel Sensing Intrinsic Amplitude: 5.8 mV
Lead Channel Setting Pacing Amplitude: 1.125
Lead Channel Setting Pacing Amplitude: 2 V
Lead Channel Setting Pacing Pulse Width: 0.5 ms
Lead Channel Setting Sensing Sensitivity: 4 mV
Pulse Gen Model: 2272
Pulse Gen Serial Number: 8219145

## 2023-09-24 ENCOUNTER — Ambulatory Visit: Payer: Self-pay | Admitting: Cardiology

## 2023-09-25 ENCOUNTER — Other Ambulatory Visit: Payer: Self-pay

## 2023-10-01 ENCOUNTER — Other Ambulatory Visit: Payer: Self-pay

## 2023-10-20 ENCOUNTER — Encounter: Payer: Self-pay | Admitting: Internal Medicine

## 2023-10-20 ENCOUNTER — Ambulatory Visit: Payer: Self-pay | Attending: Internal Medicine | Admitting: Internal Medicine

## 2023-10-20 ENCOUNTER — Other Ambulatory Visit: Payer: Self-pay

## 2023-10-20 VITALS — BP 136/80 | HR 80 | Temp 98.8°F | Ht 67.0 in | Wt 195.0 lb

## 2023-10-20 DIAGNOSIS — K219 Gastro-esophageal reflux disease without esophagitis: Secondary | ICD-10-CM

## 2023-10-20 DIAGNOSIS — I10 Essential (primary) hypertension: Secondary | ICD-10-CM

## 2023-10-20 MED ORDER — LOSARTAN POTASSIUM 25 MG PO TABS
25.0000 mg | ORAL_TABLET | Freq: Every day | ORAL | 1 refills | Status: DC
  Filled 2023-10-20: qty 90, 90d supply, fill #0

## 2023-10-20 NOTE — Progress Notes (Signed)
 Patient ID: Jaime Moore, male    DOB: 1971-03-27  MRN: [id number]  CC: Hypertension (HTN f/u/No questions/ concerns)   Subjective: Jaime Moore is a 53 y.o. male who presents for chronic ds management. His concerns today include:  Patient with history of HTN, HL, CKD 2,  GERD, history of complete heart block/pacemaker present   Discussed the use of AI scribe software for clinical note transcription with the patient, who gave verbal consent to proceed.  History of Present Illness Jaime Moore is a 53 year old male with hypertension who presents for a follow-up visit. His girlfriend Jaime Moore is with him  HTN: He takes amlodipine  10 mg daily and adheres to this regimen. Blood pressure readings are taken twice a week, with systolic values between 140 and 150 mmHg and diastolic values between 81 and 82 mmHg. He experiences no chest pain or shortness of breath, though heat can cause slight breathlessness. He limits salt intake and does not smoke.  He takes omeprazole  for acid reflux, with breakthrough symptoms about once a week, managed with Rolaids. Symptoms are often triggered by foods like spaghetti, ketchup, or acidic juices, which he tries to avoid.    Patient Active Problem List   Diagnosis Date Noted   Essential hypertension 02/14/2023   Major depressive disorder, single episode, mild (HCC) 02/14/2023   Situational anxiety 02/14/2023   Complete heart block (HCC) 02/10/2023   Dizziness 10/06/2018     Current Outpatient Medications on File Prior to Visit  Medication Sig Dispense Refill   amLODipine  (NORVASC ) 10 MG tablet Take 1 tablet (10 mg total) by mouth daily. 90 tablet 1   aspirin  EC 81 MG tablet Take 1 tablet (81 mg total) by mouth daily. Swallow whole. 90 tablet 1   omeprazole  (PRILOSEC) 20 MG capsule Take 1 capsule (20 mg total) by mouth daily. 90 capsule 1   No current facility-administered medications on file prior to visit.    No Known Allergies  Social History    Socioeconomic History   Marital status: Single    Spouse name: Not on file   Number of children: Not on file   Years of education: Not on file   Highest education level: Not on file  Occupational History   Occupation: Primary school teacher  Tobacco Use   Smoking status: Never   Smokeless tobacco: Never  Vaping Use   Vaping status: Never Used  Substance and Sexual Activity   Alcohol use: No   Drug use: No   Sexual activity: Not on file  Other Topics Concern   Not on file  Social History Narrative   Not on file   Social Drivers of Health   Financial Resource Strain: Low Risk  (06/20/2023)   Overall Financial Resource Strain (CARDIA)    Difficulty of Paying Living Expenses: Not very hard  Food Insecurity: No Food Insecurity (06/20/2023)   Hunger Vital Sign    Worried About Running Out of Food in the Last Year: Never true    Ran Out of Food in the Last Year: Never true  Transportation Needs: No Transportation Needs (06/20/2023)   PRAPARE - Administrator, Civil Service (Medical): No    Lack of Transportation (Non-Medical): No  Physical Activity: Insufficiently Active (06/20/2023)   Exercise Vital Sign    Days of Exercise per Week: 5 days    Minutes of Exercise per Session: 20 min  Stress: Stress Concern Present (06/20/2023)   [employer] of Occupational Health - Occupational  Stress Questionnaire    Feeling of Stress : To some extent  Social Connections: Socially Isolated (06/20/2023)   Social Connection and Isolation Panel    Frequency of Communication with Friends and Family: Three times a week    Frequency of Social Gatherings with Friends and Family: More than three times a week    Attends Religious Services: Never    Database administrator or Organizations: No    Attends Banker Meetings: Never    Marital Status: Never married  Intimate Partner Violence: Not At Risk (06/20/2023)   Humiliation, Afraid, Rape, and Kick questionnaire    Fear of Current  or Ex-Partner: No    Emotionally Abused: No    Physically Abused: No    Sexually Abused: No    Family History  Problem Relation Age of Onset   Cancer Mother        lung CA   Heart attack Brother    Heart disease Brother    Heart disease Maternal Grandmother    Cancer - Other Other        lung CA   Hyperlipidemia Other     Past Surgical History:  Procedure Laterality Date   PACEMAKER IMPLANT N/A 02/11/2023   Procedure: PACEMAKER IMPLANT;  Surgeon: Inocencio Soyla Lunger, MD;  Location: [facility];  Service: Cardiovascular;  Laterality: N/A;    ROS: Review of Systems Negative except as stated above  PHYSICAL EXAM: BP 136/80   Pulse 80   Temp 98.8 F (37.1 C) (Oral)   Ht 5' 7 (1.702 m)   Wt 195 lb (88.5 kg)   SpO2 97%   BMI 30.54 kg/m   Physical Exam   General appearance - alert, well appearing, and in no distress Mental status - normal mood, behavior, speech, dress, motor activity, and thought processes Neck - supple, no significant adenopathy Chest - clear to auscultation, no wheezes, rales or rhonchi, symmetric air entry Heart - normal rate, regular rhythm, normal S1, S2, no murmurs, rubs, clicks or gallops Extremities - peripheral pulses normal, no pedal edema, no clubbing or cyanosis     Latest Ref Rng & Units 02/11/2023    4:07 AM 02/09/2023    2:36 PM 10/06/2018   10:52 AM  CMP  Glucose 70 - 99 mg/dL 97  885    BUN 6 - 20 mg/dL 16  16    Creatinine 9.38 - 1.24 mg/dL 8.84  8.98  9.04   Sodium 135 - 145 mmol/L 140  138    Potassium 3.5 - 5.1 mmol/L 3.9  3.6    Chloride 98 - 111 mmol/L 104  103    CO2 22 - 32 mmol/L 24  23    Calcium 8.9 - 10.3 mg/dL 9.4  9.4     Lipid Panel     Component Value Date/Time   CHOL 203 (H) 10/07/2018 0653   TRIG 85 10/07/2018 0653   HDL 38 (L) 10/07/2018 0653   CHOLHDL 5.3 10/07/2018 0653   VLDL 17 10/07/2018 0653   LDLCALC 148 (H) 10/07/2018 0653    CBC    Component Value Date/Time   WBC 12.0 (H)  02/11/2023 0407   RBC 4.41 02/11/2023 0407   HGB 14.0 02/11/2023 0407   HCT 41.2 02/11/2023 0407   PLT 258 02/11/2023 0407   MCV 93.4 02/11/2023 0407   MCH 31.7 02/11/2023 0407   MCHC 34.0 02/11/2023 0407   RDW 12.2 02/11/2023 0407   LYMPHSABS 3.0 10/05/2018  2238   MONOABS 0.9 10/05/2018 2238   EOSABS 0.1 10/05/2018 2238   BASOSABS 0.1 10/05/2018 2238    ASSESSMENT AND PLAN:  Assessment and Plan Assessment & Plan Hypertension Hypertension uncontrolled on amlodipine  10 mg. Home readings 140-150/81-82 mmHg. Office reading improved from 151/82 to 136/80 mmHg. Goal <130/80 mmHg. Recommended adding losartan. Explained importance of consistent medication timing. - Continue amlodipine  10 mg daily. - Add losartan 25 mg daily - Continue to monitor blood pressure at home with goal being 130/80 or lower.   Gastroesophageal Reflux Disease (GERD) Reminded him of foods to avoid. - Continue omeprazole  as prescribed. - Consider extra omeprazole  occasionally if needed instead of Rolaid.   Patient was given the opportunity to ask questions.  Patient verbalized understanding of the plan and was able to repeat key elements of the plan.   This documentation was completed using Paediatric nurse.  Any transcriptional errors are unintentional.  No orders of the defined types were placed in this encounter.    Requested Prescriptions   Signed Prescriptions Disp Refills   losartan (COZAAR) 25 MG tablet 90 tablet 1    Sig: Take 1 tablet (25 mg total) by mouth daily.    Return in about 4 months (around 02/19/2024).  Barnie Louder, MD, FACP

## 2023-10-20 NOTE — Patient Instructions (Signed)
 VISIT SUMMARY:  Today, you came in for a follow-up visit to check on your hypertension and acid reflux. We discussed your current medications and how you're managing your conditions.  YOUR PLAN:  -HYPERTENSION: Hypertension, or high blood pressure, means that the force of the blood against your artery walls is too high. Your blood pressure readings at home are still higher than the goal, so we are adding losartan to your current medication, amlodipine . Please take both medications daily at the same time each day. Continue to monitor your blood pressure regularly and follow up in 4-5 months or sooner if you have any issues.  -GASTROESOPHAGEAL REFLUX DISEASE (GERD): GERD is a condition where stomach acid frequently flows back into the tube connecting your mouth and stomach, causing discomfort. You are currently managing this with omeprazole  and occasional Rolaids. If you experience breakthrough symptoms, you can take an extra dose of omeprazole  occasionally. Continue to avoid foods that trigger your symptoms.  INSTRUCTIONS:  Please follow up in 4-5 months to re-evaluate your blood pressure and GERD management, or sooner if you experience any issues.

## 2023-10-28 NOTE — Progress Notes (Signed)
 Remote pacemaker transmission.

## 2023-11-06 ENCOUNTER — Ambulatory Visit: Payer: Self-pay | Admitting: Critical Care Medicine

## 2023-11-06 ENCOUNTER — Ambulatory Visit: Payer: Self-pay

## 2023-11-06 ENCOUNTER — Ambulatory Visit
Admission: RE | Admit: 2023-11-06 | Discharge: 2023-11-06 | Disposition: A | Discharge status: Home / Self Care | Payer: Self-pay | Source: Ambulatory Visit | Attending: Critical Care Medicine | Admitting: Critical Care Medicine

## 2023-11-06 ENCOUNTER — Ambulatory Visit: Payer: Self-pay | Attending: Critical Care Medicine | Admitting: Critical Care Medicine

## 2023-11-06 ENCOUNTER — Encounter: Payer: Self-pay | Admitting: Critical Care Medicine

## 2023-11-06 VITALS — BP 146/72 | HR 88 | Ht 67.0 in | Wt 197.4 lb

## 2023-11-06 DIAGNOSIS — R42 Dizziness and giddiness: Secondary | ICD-10-CM

## 2023-11-06 DIAGNOSIS — I10 Essential (primary) hypertension: Secondary | ICD-10-CM

## 2023-11-06 DIAGNOSIS — R55 Syncope and collapse: Secondary | ICD-10-CM

## 2023-11-06 DIAGNOSIS — G4452 New daily persistent headache (NDPH): Secondary | ICD-10-CM | POA: Insufficient documentation

## 2023-11-06 DIAGNOSIS — R4020 Unspecified coma: Secondary | ICD-10-CM | POA: Insufficient documentation

## 2023-11-06 DIAGNOSIS — I442 Atrioventricular block, complete: Secondary | ICD-10-CM

## 2023-11-06 NOTE — Assessment & Plan Note (Signed)
 Pacemaker interrogation recently showed pacemaker functioning apparently he has not total complete heart block and he is heart can override the pacemaker the patient thinks the pacemaker set for heart rate in the mid to low 60s resting heart rate now in the low 90s patient's not orthostatic with the neurologic event that occurred yesterday he has not had further symptoms of this and did not have symptoms of the neurologic event prior  Referral back to electrophysiology be made he may yet need neurology referral and will obtain a stat CAT scan of the head and obtain labs

## 2023-11-06 NOTE — Assessment & Plan Note (Signed)
 See heart block assessment

## 2023-11-06 NOTE — Patient Instructions (Signed)
 An emergent CAT scan of your head will be obtained today  Labs to be obtained today  Stay out of work work note given at least till Monday until we can understand what is going on  Cardiology return appointment given ASAP  You may yet need to see neurology  Stay well-hydrated stay in air conditioning and rest today through the weekend  Return to our clinic for recheck in 1 week

## 2023-11-06 NOTE — Telephone Encounter (Signed)
 Appointment made for today

## 2023-11-06 NOTE — Telephone Encounter (Signed)
 FYI Only or Action Required?: FYI only for provider.  Patient was last seen in primary care on 10/20/2023 by Vicci Barnie NOVAK, MD.  Called Nurse Triage reporting Hypertension.  Symptoms began yesterday.  Interventions attempted: Nothing.  Symptoms are: unchanged. Dizziness, Elevated BP, doesn't feel well, HR is 90  Triage Disposition: See Physician Within 24 Hours  Patient/caregiver understands and will follow disposition?: Yes                        Copied from CRM [id number]. Topic: Clinical - Red Word Triage >> Nov 06, 2023  8:54 AM Gustabo D wrote: Patient says he don't feel good and his blood pressure has been up since yesterday lunch time and he is feeling dizzy. Reason for Disposition  [1] MODERATE dizziness (e.g., interferes with normal activities) AND [2] has NOT been evaluated by doctor (or NP/PA) for this  (Exception: Dizziness caused by heat exposure, sudden standing, or poor fluid intake.)  Answer Assessment - Initial Assessment Questions 1. BLOOD PRESSURE: What is your blood pressure? Did you take at least two measurements 5 minutes apart?     156/83 - yesterday 164/82 2. ONSET: When did you take your blood pressure?     This morning and yesterday 3. HOW: How did you take your blood pressure? (e.g., automatic home BP monitor, visiting nurse)     home 4. HISTORY: Do you have a history of high blood pressure?     yes 5. MEDICINES: Are you taking any medicines for blood pressure? Have you missed any doses recently?     no 6. OTHER SYMPTOMS: Do you have any symptoms? (e.g., blurred vision, chest pain, difficulty breathing, headache, weakness)     Dizziness - Heart racing  Answer Assessment - Initial Assessment Questions 1. DESCRIPTION: Describe your dizziness.     dizzy 5. ONSET:  When did the dizziness begin?     Yesterday afternoon 7. HEART RATE: Can you tell me your heart rate? How many beats in 15 seconds?  (Note: Not  all patients can do this.)       HR yesterday was 90 8. CAUSE: What do you think is causing the dizziness? (e.g., decreased fluids or food, diarrhea, emotional distress, heat exposure, new medicine, sudden standing, vomiting; unknown)     Elevated BP  Protocols used: Blood Pressure - High-A-AH, Dizziness - Lightheadedness-A-AH

## 2023-11-06 NOTE — Assessment & Plan Note (Signed)
 Essential hypertension no orthostasis on losartan  amlodipine  no change in medications

## 2023-11-06 NOTE — Progress Notes (Signed)
 Acute Office Visit  Subjective:     Patient ID: Jaime Moore, male    DOB: 1970/12/07, 53 y.o.   MRN: [id number]  Chief Complaint  Patient presents with   Hypertension    Patient has a pace maker and felt heart was beating harder than normal    Dizziness    HPI Patient is in today for hypertension and dizziness and momentary loss of consciousness and elevated heart rate.  Here for acute work in The patient was last seen recently by primary care Dr. Vicci for hypertension had losartan  added to amlodipine  patient states blood pressure at home ranges from 160/83 to 130/82 heart rate in the 80s to 90s.  Patient does have a history of complete heart block but according to the patient's understanding from cardiology it is not a total heart block and his heart rate can elevate above the paced rhythm.  He was having lunch yesterday when he became acutely dizzy and went into a daze for a few minutes and would not respond to his family members he had no chest pain his heart rate was elevated I do not know the exact rate he also had some shortness of breath today orthostatic blood pressures he dropped slightly 222/75 with blood pressure on arrival elevated 146/72 pulse rate stays in the mid 90s throughout the orthostatic blood pressure checks  He has only had this 1 episode never had other episodes before.  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  He does try to stay well-hydrated.  He takes medications for blood pressure together at the same time in the morning.  He also has a mild headache which is new in onset.  He has no chest pain or radiation of the chest pain MRI last October when the heart rate issues occurred was normal except for left ventricular hypertrophy same for the echocardiogram no evidence of segmental wall abnormalities  Recent interrogation of the pacemaker that he has was normal and that it was functioning     Review of Systems  Constitutional:  Negative for  chills, diaphoresis, fever, malaise/fatigue and weight loss.  HENT:  Negative for congestion, hearing loss, nosebleeds, sore throat and tinnitus.   Eyes:  Negative for blurred vision, photophobia and redness.  Respiratory:  Positive for shortness of breath. Negative for cough, hemoptysis, sputum production, wheezing and stridor.   Cardiovascular:  Positive for chest pain and palpitations. Negative for orthopnea, claudication, leg swelling and PND.  Gastrointestinal:  Negative for abdominal pain, blood in stool, constipation, diarrhea, heartburn, nausea and vomiting.  Genitourinary:  Negative for dysuria, flank pain, frequency, hematuria and urgency.  Musculoskeletal:  Negative for back pain, falls, joint pain, myalgias and neck pain.  Skin:  Negative for itching and rash.  Neurological:  Positive for dizziness and loss of consciousness. Negative for tingling, tremors, sensory change, speech change, focal weakness, seizures, weakness and headaches.  Endo/Heme/Allergies:  Negative for environmental allergies and polydipsia. Does not bruise/bleed easily.  Psychiatric/Behavioral:  Negative for depression, memory loss, substance abuse and suicidal ideas. The patient is not nervous/anxious and does not have insomnia.         Objective:    BP (!) 146/72 (BP Location: Left Arm, Patient Position: Sitting, Cuff Size: Normal)   Pulse 88   Ht 5' 7 (1.702 m)   Wt 197 lb 6.4 oz (89.5 kg)   SpO2 98%   BMI 30.92 kg/m    Physical Exam Vitals reviewed.  Constitutional:  Appearance: Normal appearance. He is well-developed. He is not diaphoretic.  HENT:     Head: Normocephalic and atraumatic.     Nose: No nasal deformity, septal deviation, mucosal edema or rhinorrhea.     Right Sinus: No maxillary sinus tenderness or frontal sinus tenderness.     Left Sinus: No maxillary sinus tenderness or frontal sinus tenderness.     Mouth/Throat:     Pharynx: No oropharyngeal exudate.  Eyes:     General:  No scleral icterus.    Conjunctiva/sclera: Conjunctivae normal.     Pupils: Pupils are equal, round, and reactive to light.  Neck:     Thyroid : No thyromegaly.     Vascular: No carotid bruit or JVD.     Trachea: Trachea normal. No tracheal tenderness or tracheal deviation.  Cardiovascular:     Rate and Rhythm: Normal rate and regular rhythm.     Chest Wall: PMI is not displaced.     Pulses: Normal pulses. No decreased pulses.     Heart sounds: Normal heart sounds, S1 normal and S2 normal. Heart sounds not distant. No murmur heard.    No systolic murmur is present.     No diastolic murmur is present.     No friction rub. No gallop. No S3 or S4 sounds.     Comments: Heart rate mid to low 90s no orthostasis with blood pressure checks except for mild reduction in systolic from 146-122 Pulmonary:     Effort: Pulmonary effort is normal. No tachypnea, accessory muscle usage or respiratory distress.     Breath sounds: Normal breath sounds. No stridor. No decreased breath sounds, wheezing, rhonchi or rales.  Chest:     Chest wall: No tenderness.  Abdominal:     General: Bowel sounds are normal. There is no distension.     Palpations: Abdomen is soft. Abdomen is not rigid.     Tenderness: There is no abdominal tenderness. There is no guarding or rebound.  Musculoskeletal:        General: No swelling. Normal range of motion.     Cervical back: Normal range of motion and neck supple. No edema, erythema or rigidity. No muscular tenderness. Normal range of motion.  Lymphadenopathy:     Head:     Right side of head: No submental or submandibular adenopathy.     Left side of head: No submental or submandibular adenopathy.     Cervical: No cervical adenopathy.  Skin:    General: Skin is warm and dry.     Coloration: Skin is not pale.     Findings: No rash.     Nails: There is no clubbing.  Neurological:     General: No focal deficit present.     Mental Status: He is alert and oriented to  person, place, and time.     Cranial Nerves: No cranial nerve deficit.     Sensory: No sensory deficit.     Motor: No weakness.     Coordination: Coordination normal.     Gait: Gait normal.  Psychiatric:        Mood and Affect: Mood normal.        Speech: Speech normal.        Behavior: Behavior normal.        Thought Content: Thought content normal.     No results found for any visits on 11/06/23.      Assessment & Plan:   Problem List Items Addressed This Visit  Cardiovascular and Mediastinum   Complete heart block (HCC)   Pacemaker interrogation recently showed pacemaker functioning apparently he has not total complete heart block and he is heart can override the pacemaker the patient thinks the pacemaker set for heart rate in the mid to low 60s resting heart rate now in the low 90s patient's not orthostatic with the neurologic event that occurred yesterday he has not had further symptoms of this and did not have symptoms of the neurologic event prior  Referral back to electrophysiology be made he may yet need neurology referral and will obtain a stat CAT scan of the head and obtain labs      Relevant Orders   Ambulatory referral to Cardiology   Essential hypertension   Essential hypertension no orthostasis on losartan  amlodipine  no change in medications      Relevant Orders   CMP14+EGFR   CBC with Differential/Platelet     Other   Dizziness   See heart block assessment      Relevant Orders   CT HEAD WO CONTRAST ( )   CMP14+EGFR   CBC with Differential/Platelet   Ambulatory referral to Cardiology   Loss of consciousness (HCC) - Primary   See heart block assessment      Relevant Orders   CT HEAD WO CONTRAST ( )   CMP14+EGFR   CBC with Differential/Platelet   Ambulatory referral to Cardiology   New daily persistent headache   Relevant Orders   CT HEAD WO CONTRAST ( )   CMP14+EGFR   CBC with Differential/Platelet  30 minutes spent due to high  complexity of problems and multi assessments high risk   No orders of the defined types were placed in this encounter.  CT of the head will be obtained at [facility] patient safe to ride there with his girlfriend driving he needs to be out of work until evaluations are complete through the weekend and work note given Return in about 1 week (around 11/13/2023) for Presyncope loss of consciousness.  Belvie Silvan, MD

## 2023-11-06 NOTE — Progress Notes (Signed)
 Pt aware CT is normal

## 2023-11-07 LAB — CMP14+EGFR
ALT: 20 IU/L (ref 0–44)
AST: 19 IU/L (ref 0–40)
Albumin: 4.8 g/dL (ref 3.8–4.9)
Alkaline Phosphatase: 85 IU/L (ref 44–121)
BUN/Creatinine Ratio: 15 (ref 9–20)
BUN: 17 mg/dL (ref 6–24)
Bilirubin Total: 0.4 mg/dL (ref 0.0–1.2)
CO2: 19 mmol/L — ABNORMAL LOW (ref 20–29)
Calcium: 10.2 mg/dL (ref 8.7–10.2)
Chloride: 103 mmol/L (ref 96–106)
Creatinine, Ser: 1.15 mg/dL (ref 0.76–1.27)
Globulin, Total: 2.8 g/dL (ref 1.5–4.5)
Glucose: 128 mg/dL — ABNORMAL HIGH (ref 70–99)
Potassium: 4.5 mmol/L (ref 3.5–5.2)
Sodium: 141 mmol/L (ref 134–144)
Total Protein: 7.6 g/dL (ref 6.0–8.5)
eGFR: 76 mL/min/1.73 (ref >59)

## 2023-11-07 LAB — CBC WITH DIFFERENTIAL/PLATELET
Basophils Absolute: 0.1 x10E3/uL (ref 0.0–0.2)
Basos: 1 %
EOS (ABSOLUTE): 0 x10E3/uL (ref 0.0–0.4)
Eos: 0 %
Hematocrit: 44.2 % (ref 37.5–51.0)
Hemoglobin: 14 g/dL (ref 13.0–17.7)
Immature Grans (Abs): 0 x10E3/uL (ref 0.0–0.1)
Immature Granulocytes: 0 %
Lymphocytes Absolute: 1.7 x10E3/uL (ref 0.7–3.1)
Lymphs: 16 %
MCH: 30.7 pg (ref 26.6–33.0)
MCHC: 31.7 g/dL (ref 31.5–35.7)
MCV: 97 fL (ref 79–97)
Monocytes Absolute: 0.5 x10E3/uL (ref 0.1–0.9)
Monocytes: 5 %
Neutrophils Absolute: 8.6 x10E3/uL — ABNORMAL HIGH (ref 1.4–7.0)
Neutrophils: 78 %
Platelets: 333 x10E3/uL (ref 150–450)
RBC: 4.56 x10E6/uL (ref 4.14–5.80)
RDW: 12.7 % (ref 11.6–15.4)
WBC: 11 x10E3/uL — ABNORMAL HIGH (ref 3.4–10.8)

## 2023-11-07 NOTE — Telephone Encounter (Signed)
 Copied from CRM [id number]. Topic: Clinical - Lab/Test Results >> Nov 07, 2023 11:27 AM Jaime Moore wrote:  Reason for CRM: The patient called in for his lab results. I relayed to him that his provider stated everything was normal. He has a follow up on July 22nd but wants to know where to go from here since everything stated it was normal? Please assist patient further

## 2023-11-12 NOTE — Progress Notes (Unsigned)
 Electrophysiology Clinic Note    Date:  11/13/2023  Patient ID:  Jaime Moore, Jaime Moore [birth year], MRN [id number] PCP:  Vicci Barnie NOVAK, MD  Cardiologist:  Deatrice Cage, MD   Electrophysiologist:  Soyla Gladis Norton, MD  Electrophysiology APP:  Fisher Hargadon, NP    Discussed the use of AI scribe software for clinical note transcription with the patient, who gave verbal consent to proceed.   Patient Profile    Chief Complaint: presyncope  History of Present Illness: Jaime Moore is a 53 y.o. male with PMH notable for CHB s/p PPM, HTN, HLD; seen today for Will Gladis Norton, MD for acute visit due to presyncope.    He last saw Dr. Norton 04/2023 for routine 3 month post-PPM follow-up and was doing well.  He had episode last week where he feeling distant and out of it, he could hear people talking around him but could not respond. He felt heavy-headed, dizzy, and then felt like his heart was beating very hard in his chest. He recovered and return home, BP at home was elevated. No LOC.  He continues to have intermittent episodes of dizziness, has noticed that he is more dizzy sitting still than when he is up and moving.   He saw PCP after this episode who ordered Head CT, which was normal.  His PCP has been adjusting BP meds. He checked Bp this morning at home, reading was 120/80s  He denies chest pain, chest pressure, palpitations currently.    Arrhythmia/Device History St. Jude dual chamber PPM, imp 01/2023; dx CHB    ROS:  Please see the history of present illness. All other systems are reviewed and otherwise negative.    Physical Exam    VS:  BP 136/84 (BP Location: Left Arm, Patient Position: Sitting, Cuff Size: Normal)   Pulse 70   Ht 5' 7 (1.702 m)   Wt 195 lb 9.6 oz (88.7 kg)   SpO2 99%   BMI 30.64 kg/m  BMI: Body mass index is 30.64 kg/m.      Wt Readings from Last 3 Encounters:  11/13/23 195 lb 9.6 oz (88.7 kg)  11/06/23 197 lb 6.4 oz (89.5 kg)   10/20/23 195 lb (88.5 kg)     GEN- The patient is well appearing, alert and oriented x 3 today.   Lungs- Clear to ausculation bilaterally, normal work of breathing.  Heart- Regular rate and rhythm, no murmurs, rubs or gallops Extremities- No peripheral edema, warm, dry Skin-  device pocket well-healed, no tethering   Device interrogation done today and reviewed by myself:  Battery 9+ years Lead thresholds, impedence, sensing stable  Intrinsic appears 2:1 HB in mid-40s No episodes Good HR histograms High VP No changes made today   Studies Reviewed   Previous EP, cardiology notes.    EKG is ordered. Personal review of EKG from today shows:    EKG Interpretation Date/Time:  Thursday November 13 2023 09:28:12 EDT Ventricular Rate:  70 PR Interval:  180 QRS Duration:  160 QT Interval:  450 QTC Calculation: 486 R Axis:   85  Text Interpretation: Atrial-sensed ventricular-paced rhythm Confirmed by Mariann Palo [phone]) on 11/13/2023 9:36:21 AM     TTE, 02/11/2023  1. Left ventricular ejection fraction, by estimation, is 70 to 75%. The left ventricle has hyperdynamic function. The left ventricle has no regional wall motion abnormalities. Left ventricular diastolic parameters are indeterminate.   2. Right ventricular systolic function is normal. The right ventricular size is dilated.  3. The mitral valve is grossly normal. No evidence of mitral valve regurgitation.   4. The aortic valve is tricuspid. Aortic valve regurgitation is trivial. No aortic stenosis is present.   5. The inferior vena cava is normal in size with greater than 50% respiratory variability, suggesting right atrial pressure of 3 mmHg.   Comparison(s): Similar to prior reporting but with new heart block.   Cardiac MRI, 02/10/2023 1. Normal LV size and systolic function.  LVEF 67%.  2. No LGE or scar.  3. No evidence for myocardial infiltration.  4. Normal RV size and function.  5. No significant valvular  abnormalities.    Assessment and Plan     #) CHB s/p PPM Device functioning well, see paceart for details No episodes to explain patient's recent symptoms High VP   #) HTN Slightly elevated in office, but well-controlled at home Continue to monitor BP at home and record measurements Bring to PCP appts          Current medicines are reviewed at length with the patient today.   The patient does not have concerns regarding his medicines.  The following changes were made today:  none  Labs/ tests ordered today include:  Orders Placed This Encounter  Procedures   EKG 12-Lead     Disposition: Follow up with Dr. Inocencio or EP APP in 6-9 months    Signed, Chantal Needle, NP  11/13/23  10:07 AM  Electrophysiology [facility]

## 2023-11-13 ENCOUNTER — Ambulatory Visit: Payer: Self-pay | Admitting: Cardiology

## 2023-11-13 ENCOUNTER — Ambulatory Visit: Payer: Self-pay | Attending: Cardiology | Admitting: Cardiology

## 2023-11-13 VITALS — BP 136/84 | HR 70 | Ht 67.0 in | Wt 195.6 lb

## 2023-11-13 DIAGNOSIS — Z950 Presence of cardiac pacemaker: Secondary | ICD-10-CM

## 2023-11-13 DIAGNOSIS — I10 Essential (primary) hypertension: Secondary | ICD-10-CM

## 2023-11-13 DIAGNOSIS — I442 Atrioventricular block, complete: Secondary | ICD-10-CM

## 2023-11-13 LAB — CUP PACEART INCLINIC DEVICE CHECK
Battery Remaining Longevity: 109 mo
Battery Voltage: 2.99 V
Brady Statistic RA Percent Paced: 31 %
Brady Statistic RV Percent Paced: 99.95 %
Date Time Interrogation Session: 20250717102454
Implantable Lead Connection Status: 753985
Implantable Lead Connection Status: 753985
Implantable Lead Implant Date: 20241015
Implantable Lead Implant Date: 20241015
Implantable Lead Location: 753859
Implantable Lead Location: 753860
Implantable Pulse Generator Implant Date: 20241015
Lead Channel Impedance Value: 425 Ohm
Lead Channel Impedance Value: 525 Ohm
Lead Channel Pacing Threshold Amplitude: 0.75 V
Lead Channel Pacing Threshold Amplitude: 0.75 V
Lead Channel Pacing Threshold Amplitude: 1 V
Lead Channel Pacing Threshold Amplitude: 1 V
Lead Channel Pacing Threshold Pulse Width: 0.5 ms
Lead Channel Pacing Threshold Pulse Width: 0.5 ms
Lead Channel Pacing Threshold Pulse Width: 0.5 ms
Lead Channel Pacing Threshold Pulse Width: 0.5 ms
Lead Channel Sensing Intrinsic Amplitude: 5 mV
Lead Channel Sensing Intrinsic Amplitude: 6.3 mV
Lead Channel Setting Pacing Amplitude: 1.125
Lead Channel Setting Pacing Amplitude: 2 V
Lead Channel Setting Pacing Pulse Width: 0.5 ms
Lead Channel Setting Sensing Sensitivity: 3 mV
Pulse Gen Model: 2272
Pulse Gen Serial Number: 8219145

## 2023-11-13 NOTE — Patient Instructions (Signed)
 Medication Instructions:   Your physician recommends that you continue on your current medications as directed. Please refer to the Current Medication list given to you today.   *If you need a refill on your cardiac medications before your next appointment, please call your pharmacy*  Lab Work:  No labs ordered today   If you have labs (blood work) drawn today and your tests are completely normal, you will receive your results only by: MyChart Message (if you have MyChart) OR A paper copy in the mail If you have any lab test that is abnormal or we need to change your treatment, we will call you to review the results.  Testing/Procedures:  No test ordered today   Follow-Up: At [facility], you and your health needs are our priority.  As part of our continuing mission to provide you with exceptional heart care, our providers are all part of one team.  This team includes your primary Cardiologist (physician) and Advanced Practice Providers or APPs (Physician Assistants and Nurse Practitioners) who all work together to provide you with the care you need, when you need it.  Your next appointment:    6 month(s) EP follow up  Provider:   Suzann Riddle NP

## 2023-11-14 ENCOUNTER — Telehealth: Payer: Self-pay | Admitting: Internal Medicine

## 2023-11-14 NOTE — Telephone Encounter (Signed)
 Called patient, no answer. Left voicemail confirming upcoming appointment on 11/18/2023 at 9:10 am with Dr. Vicci. Provided callback number for any questions or changes.

## 2023-11-18 ENCOUNTER — Other Ambulatory Visit: Payer: Self-pay

## 2023-11-18 ENCOUNTER — Ambulatory Visit: Payer: Self-pay | Attending: Internal Medicine | Admitting: Internal Medicine

## 2023-11-18 VITALS — BP 136/78 | HR 67 | Temp 97.9°F | Ht 67.0 in

## 2023-11-18 DIAGNOSIS — R0981 Nasal congestion: Secondary | ICD-10-CM

## 2023-11-18 DIAGNOSIS — R0602 Shortness of breath: Secondary | ICD-10-CM

## 2023-11-18 DIAGNOSIS — H6123 Impacted cerumen, bilateral: Secondary | ICD-10-CM

## 2023-11-18 DIAGNOSIS — R739 Hyperglycemia, unspecified: Secondary | ICD-10-CM

## 2023-11-18 DIAGNOSIS — I10 Essential (primary) hypertension: Secondary | ICD-10-CM

## 2023-11-18 DIAGNOSIS — R42 Dizziness and giddiness: Secondary | ICD-10-CM

## 2023-11-18 MED ORDER — FLUTICASONE PROPIONATE 50 MCG/ACT NA SUSP
1.0000 | Freq: Every day | NASAL | 6 refills | Status: AC
  Filled 2023-11-18: qty 16, 60d supply, fill #0
  Filled 2024-02-11 (×2): qty 16, 60d supply, fill #1
  Filled 2024-05-03: qty 16, 60d supply, fill #2

## 2023-11-18 MED ORDER — HYDROCHLOROTHIAZIDE 12.5 MG PO TABS
12.5000 mg | ORAL_TABLET | Freq: Every day | ORAL | 3 refills | Status: AC
  Filled 2023-11-18: qty 90, 90d supply, fill #0
  Filled 2023-12-30 – 2024-02-11 (×4): qty 90, 90d supply, fill #1
  Filled 2024-05-15: qty 90, 90d supply, fill #2

## 2023-11-18 NOTE — Progress Notes (Signed)
 Patient ID: Jaime Moore, male    DOB: [birth year]  MRN: [id number]  CC: Follow-up (Follow-up. /Ear wax build-up on both ears, difficulty hearing /Dizziness, SOB X2 weeks /Yes to Hep B vax )   Subjective: Jaime Moore is a 53 y.o. male who presents for chronic ds management. His concerns today include:  Patient with history of HTN, HL, CKD 2,  GERD, history of complete heart block/pacemaker present   Discussed the use of AI scribe software for clinical note transcription with the patient, who gave verbal consent to proceed.  History of Present Illness Jaime Moore is a 53 year old male with a pacemaker who presents with dizziness and shortness of breath.  He first noticed dizziness and shortness of breath two weeks ago after an episode at a restaurant. 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 Episodes last about 30 seconds and are sometimes accompanied by shortness of breath lasting 10 to 20 seconds. The dizziness can be exacerbated by rapid head movements but does not occur when lying down or turning over in bed.  No recent loss of consciousness, but he describes a sensation of 'spacing out' during the initial episode. Occasional tinnitus is not consistently associated with the dizziness. He experiences a feeling of ear clogging, which began around the same time as the dizziness, and has been having headaches across his forehead, described as pressure-like, lasting a few minutes. He notes sinus drainage, primarily forward, without discolored mucus or nasal congestion.  He has a history of hypertension and started lorisatin at the end of June. He reports that the dizziness may have started around the time he began this medication. His blood pressure readings at home have been around 123/82, but he notes that his blood pressure was higher during today's visit.  No recent long-distance travel, swelling in the legs,  or fever. He reports adequate fluid intake and no recent changes in his physical activity level. 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    Patient Active Problem List   Diagnosis Date Noted   Loss of consciousness (HCC) 11/06/2023   New daily persistent headache 11/06/2023   Essential hypertension 02/14/2023   Major depressive disorder, single episode, mild (HCC) 02/14/2023   Situational anxiety 02/14/2023   Complete heart block (HCC) 02/10/2023   Dizziness 10/06/2018     Current Outpatient Medications on File Prior to Visit  Medication Sig Dispense Refill   amLODipine  (NORVASC ) 10 MG tablet Take 1 tablet (10 mg total) by mouth daily. 90 tablet 1   aspirin  EC 81 MG tablet Take 1 tablet (81 mg total) by mouth daily. Swallow whole. 90 tablet 1   losartan  (COZAAR ) 25 MG tablet Take 1 tablet (25 mg total) by mouth daily. 90 tablet 1   omeprazole  (PRILOSEC) 20 MG capsule Take 1 capsule (20 mg total) by mouth daily. 90 capsule 1   No current facility-administered medications on file prior to visit.    No Known Allergies  Social History   Socioeconomic History   Marital status: Single    Spouse name: Not on file   Number of children: Not on file   Years of education: Not on file   Highest education level: Not on file  Occupational History   Occupation: Primary school teacher  Tobacco Use   Smoking status: Never   Smokeless tobacco: Never  Vaping Use   Vaping status: Never Used  Substance  and Sexual Activity   Alcohol use: No   Drug use: No   Sexual activity: Not on file  Other Topics Concern   Not on file  Social History Narrative   Not on file   Social Drivers of Health   Financial Resource Strain: Low Risk  (06/20/2023)   Overall Financial Resource Strain (CARDIA)    Difficulty of Paying Living Expenses: Not very hard  Food Insecurity: No Food Insecurity (06/20/2023)   Hunger Vital Sign    Worried About Running Out of Food in the Last Year: Never true    Ran Out of Food  in the Last Year: Never true  Transportation Needs: No Transportation Needs (06/20/2023)   PRAPARE - Administrator, Civil Service (Medical): No    Lack of Transportation (Non-Medical): No  Physical Activity: Insufficiently Active (06/20/2023)   Exercise Vital Sign    Days of Exercise per Week: 5 days    Minutes of Exercise per Session: 20 min  Stress: Stress Concern Present (06/20/2023)   [employer] of Occupational Health - Occupational Stress Questionnaire    Feeling of Stress : To some extent  Social Connections: Socially Isolated (06/20/2023)   Social Connection and Isolation Panel    Frequency of Communication with Friends and Family: Three times a week    Frequency of Social Gatherings with Friends and Family: More than three times a week    Attends Religious Services: Never    Database administrator or Organizations: No    Attends Banker Meetings: Never    Marital Status: Never married  Intimate Partner Violence: Not At Risk (06/20/2023)   Humiliation, Afraid, Rape, and Kick questionnaire    Fear of Current or Ex-Partner: No    Emotionally Abused: No    Physically Abused: No    Sexually Abused: No    Family History  Problem Relation Age of Onset   Cancer Mother        lung CA   Heart attack Brother    Heart disease Brother    Heart disease Maternal Grandmother    Cancer - Other Other        lung CA   Hyperlipidemia Other     Past Surgical History:  Procedure Laterality Date   PACEMAKER IMPLANT N/A 02/11/2023   Procedure: PACEMAKER IMPLANT;  Surgeon: Inocencio Soyla Lunger, MD;  Location: [facility];  Service: Cardiovascular;  Laterality: N/A;    ROS: Review of Systems Negative except as stated above  PHYSICAL EXAM: BP 136/78 (BP Location: Left Arm, Patient Position: Sitting, Cuff Size: Normal)   Pulse 67   Temp 97.9 F (36.6 C) (Oral)   Ht 5' 7 (1.702 m)   SpO2 99%   BMI 30.64 kg/m   Physical Exam   {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}     Latest Ref Rng & Units 11/06/2023   11:36 AM 02/11/2023    4:07 AM 02/09/2023    2:36 PM  CMP  Glucose 70 - 99 mg/dL 871  97  885   BUN 6 - 24 mg/dL 17  16  16    Creatinine 0.76 - 1.27 mg/dL 8.84  8.84  8.98   Sodium 134 - 144 mmol/L 141  140  138   Potassium 3.5 - 5.2 mmol/L 4.5  3.9  3.6   Chloride 96 - 106 mmol/L 103  104  103   CO2 20 - 29 mmol/L 19  24  23    Calcium 8.7 -  10.2 mg/dL 89.7  9.4  9.4   Total Protein 6.0 - 8.5 g/dL 7.6     Total Bilirubin 0.0 - 1.2 mg/dL 0.4     Alkaline Phos 44 - 121 IU/L 85     AST 0 - 40 IU/L 19     ALT 0 - 44 IU/L 20      Lipid Panel     Component Value Date/Time   CHOL 203 (H) 10/07/2018 0653   TRIG 85 10/07/2018 0653   HDL 38 (L) 10/07/2018 0653   CHOLHDL 5.3 10/07/2018 0653   VLDL 17 10/07/2018 0653   LDLCALC 148 (H) 10/07/2018 0653    CBC    Component Value Date/Time   WBC 11.0 (H) 11/06/2023 1136   WBC 12.0 (H) 02/11/2023 0407   RBC 4.56 11/06/2023 1136   RBC 4.41 02/11/2023 0407   HGB 14.0 11/06/2023 1136   HCT 44.2 11/06/2023 1136   PLT 333 11/06/2023 1136   MCV 97 11/06/2023 1136   MCH 30.7 11/06/2023 1136   MCH 31.7 02/11/2023 0407   MCHC 31.7 11/06/2023 1136   MCHC 34.0 02/11/2023 0407   RDW 12.7 11/06/2023 1136   LYMPHSABS 1.7 11/06/2023 1136   MONOABS 0.9 10/05/2018 2238   EOSABS 0.0 11/06/2023 1136   BASOSABS 0.1 11/06/2023 1136    ASSESSMENT AND PLAN:  Assessment and Plan Assessment & Plan Dizziness and Shortness of Breath Intermittent dizziness and shortness of breath, possibly due to lorisatin. Differential includes benign positional vertigo and medication side effects. Blood pressure suboptimal, lorisatin may contribute to symptoms. - Stop lorisatin. - Start hydrochlorothiazide  12.5 mg daily. - Ensure hydration with 4-8 glasses of water daily. - Refer to neurology for dizziness evaluation. - Submit referral for physical therapy for dizziness. - Order D-dimer test for pulmonary  embolism screening. - Order diabetes screening for elevated blood sugar.  Hypertension Blood pressure not at target. Loristatin may contribute to dizziness. Hydrochlorothiazide  chosen to manage blood pressure without lisinopril's angioedema risk. - Monitor blood pressure at home.  Sinus Headache Headaches with sinus drainage and ear clogging suggest sinus etiology. Flonase  prescribed. - Prescribe Flonase  nasal spray, one spray in each nostril daily for five days, then as needed.  Ear Wax Buildup Significant ear wax buildup causing hearing issues. Partial removal improved hearing. - Use over-the-counter ear wax softener for 3-4 days. - Schedule follow-up for ear flushing by nurse.     There are no diagnoses linked to this encounter.   Patient was given the opportunity to ask questions.  Patient verbalized understanding of the plan and was able to repeat key elements of the plan.   This documentation was completed using Paediatric nurse.  Any transcriptional errors are unintentional.  No orders of the defined types were placed in this encounter.    Requested Prescriptions    No prescriptions requested or ordered in this encounter    No follow-ups on file.  Barnie Louder, MD, FACP

## 2023-11-18 NOTE — Patient Instructions (Signed)
 VISIT SUMMARY:  During your visit, we discussed your recent dizziness and shortness of breath, which may be related to your blood pressure medication. We also addressed your sinus headaches and ear wax buildup.  YOUR PLAN:  -DIZZINESS AND SHORTNESS OF BREATH: Your dizziness and shortness of breath may be side effects of your current blood pressure medication, lorisatin. We will stop Losartan  and start you on hydrochlorothiazide  12.5 mg daily. Ensure you drink 4-8 glasses of water daily to stay hydrated. We will refer you to a neurologist and physical therapy for further evaluation of your dizziness. Additionally, we will conduct a D-dimer test to screen for a possible blood clot in your lungs and a diabetes screening due to elevated blood sugar.  -HYPERTENSION: Your blood pressure is not at the target level, and Losartanmay be contributing to your dizziness. We will switch your medication to hydrochlorothiazide  to manage your blood pressure. Please monitor your blood pressure at home regularly.  -SINUS HEADACHE: Your headaches, along with sinus drainage and ear clogging, suggest a sinus issue. We have prescribed Flonase  nasal spray. Use one spray in each nostril daily for five days, then as needed.  -EAR WAX BUILDUP: You have significant ear wax buildup, which is affecting your hearing. We partially removed some ear wax today, which improved your hearing. Use an over-the-counter ear wax softener for 3-4 days, and schedule a follow-up appointment for ear flushing by the nurse.  INSTRUCTIONS:  Please follow up with the neurology and physical therapy referrals for your dizziness. Monitor your blood pressure at home and keep a log of your readings. Use the Flonase  nasal spray as directed and the ear wax softener for 3-4 days. Schedule a follow-up appointment for ear flushing by the nurse.

## 2023-11-19 ENCOUNTER — Encounter: Payer: Self-pay | Admitting: Internal Medicine

## 2023-11-19 ENCOUNTER — Ambulatory Visit: Payer: Self-pay | Admitting: Internal Medicine

## 2023-11-19 LAB — D-DIMER, QUANTITATIVE: D-DIMER: 0.29 mg{FEU}/L (ref 0.00–0.49)

## 2023-11-19 LAB — HEMOGLOBIN A1C
Est. average glucose Bld gHb Est-mCnc: 108 mg/dL
Hgb A1c MFr Bld: 5.4 % (ref 4.8–5.6)

## 2023-11-25 ENCOUNTER — Ambulatory Visit: Payer: Self-pay

## 2023-11-28 ENCOUNTER — Encounter: Payer: Self-pay | Admitting: Emergency Medicine

## 2023-11-28 ENCOUNTER — Emergency Department: Payer: Self-pay

## 2023-11-28 ENCOUNTER — Other Ambulatory Visit: Payer: Self-pay

## 2023-11-28 ENCOUNTER — Emergency Department
Admission: EM | Admit: 2023-11-28 | Discharge: 2023-11-28 | Disposition: A | Discharge status: Home / Self Care | Payer: Self-pay | Attending: Emergency Medicine | Admitting: Emergency Medicine

## 2023-11-28 DIAGNOSIS — E876 Hypokalemia: Secondary | ICD-10-CM | POA: Insufficient documentation

## 2023-11-28 DIAGNOSIS — N182 Chronic kidney disease, stage 2 (mild): Secondary | ICD-10-CM | POA: Insufficient documentation

## 2023-11-28 DIAGNOSIS — M4802 Spinal stenosis, cervical region: Secondary | ICD-10-CM | POA: Insufficient documentation

## 2023-11-28 DIAGNOSIS — I129 Hypertensive chronic kidney disease with stage 1 through stage 4 chronic kidney disease, or unspecified chronic kidney disease: Secondary | ICD-10-CM | POA: Insufficient documentation

## 2023-11-28 DIAGNOSIS — R42 Dizziness and giddiness: Secondary | ICD-10-CM | POA: Insufficient documentation

## 2023-11-28 LAB — CBC WITH DIFFERENTIAL/PLATELET
Abs Immature Granulocytes: 0.02 K/uL (ref 0.00–0.07)
Basophils Absolute: 0 K/uL (ref 0.0–0.1)
Basophils Relative: 1 %
Eosinophils Absolute: 0.1 K/uL (ref 0.0–0.5)
Eosinophils Relative: 1 %
HCT: 39.7 % (ref 39.0–52.0)
Hemoglobin: 14.6 g/dL (ref 13.0–17.0)
Immature Granulocytes: 0 %
Lymphocytes Relative: 24 %
Lymphs Abs: 2 K/uL (ref 0.7–4.0)
MCH: 32.7 pg (ref 26.0–34.0)
MCHC: 36.8 g/dL — ABNORMAL HIGH (ref 30.0–36.0)
MCV: 88.8 fL (ref 80.0–100.0)
Monocytes Absolute: 0.7 K/uL (ref 0.1–1.0)
Monocytes Relative: 8 %
Neutro Abs: 5.6 K/uL (ref 1.7–7.7)
Neutrophils Relative %: 66 %
Platelets: 321 K/uL (ref 150–400)
RBC: 4.47 MIL/uL (ref 4.22–5.81)
RDW: 11.9 % (ref 11.5–15.5)
WBC: 8.5 K/uL (ref 4.0–10.5)
nRBC: 0 % (ref 0.0–0.2)

## 2023-11-28 LAB — COMPREHENSIVE METABOLIC PANEL WITH GFR
ALT: 20 U/L (ref 0–44)
AST: 25 U/L (ref 15–41)
Albumin: 4.3 g/dL (ref 3.5–5.0)
Alkaline Phosphatase: 68 U/L (ref 38–126)
Anion gap: 11 (ref 5–15)
BUN: 16 mg/dL (ref 6–20)
CO2: 25 mmol/L (ref 22–32)
Calcium: 9.3 mg/dL (ref 8.9–10.3)
Chloride: 102 mmol/L (ref 98–111)
Creatinine, Ser: 1.04 mg/dL (ref 0.61–1.24)
GFR, Estimated: >60 mL/min (ref >60)
Glucose, Bld: 109 mg/dL — ABNORMAL HIGH (ref 70–99)
Potassium: 3.1 mmol/L — ABNORMAL LOW (ref 3.5–5.1)
Sodium: 138 mmol/L (ref 135–145)
Total Bilirubin: 1 mg/dL (ref 0.0–1.2)
Total Protein: 7.4 g/dL (ref 6.5–8.1)

## 2023-11-28 LAB — TROPONIN I (HIGH SENSITIVITY): Troponin I (High Sensitivity): 3 ng/L (ref <18)

## 2023-11-28 LAB — TSH: TSH: 2.171 u[IU]/mL (ref 0.350–4.500)

## 2023-11-28 LAB — MAGNESIUM: Magnesium: 2.1 mg/dL (ref 1.7–2.4)

## 2023-11-28 MED ORDER — MECLIZINE HCL 25 MG PO TABS
25.0000 mg | ORAL_TABLET | Freq: Three times a day (TID) | ORAL | 0 refills | Status: AC | PRN
Start: 2023-11-28 — End: ?

## 2023-11-28 MED ORDER — POTASSIUM CHLORIDE 20 MEQ PO PACK
40.0000 meq | PACK | Freq: Once | ORAL | Status: AC
  Administered 2023-11-28: 40 meq via ORAL
  Filled 2023-11-28: qty 2

## 2023-11-28 MED ORDER — LACTATED RINGERS IV BOLUS
1000.0000 mL | Freq: Once | INTRAVENOUS | Status: AC
  Administered 2023-11-28: 1000 mL via INTRAVENOUS

## 2023-11-28 MED ORDER — IOHEXOL 350 MG/ML SOLN
75.0000 mL | Freq: Once | INTRAVENOUS | Status: AC | PRN
  Administered 2023-11-28: 75 mL via INTRAVENOUS

## 2023-11-28 NOTE — ED Notes (Signed)
 EKG repeated per Dr Waymond.

## 2023-11-28 NOTE — ED Provider Notes (Signed)
 [facility] Provider Note    Event Date/Time   First MD Initiated Contact with Patient 11/28/23 [phone]     (approximate)   History   Dizziness   HPI  Jaime Moore is a 53 y.o. male past medical history significant for CHB s/p abbott, St Jude PPM, CKD 2, GERD, hypertension, hyperlipidemia, who presents to the emergency department for dizziness.  Patient endorses 1 month of intermittent episodes of dizziness.  Patient states that he has had intermittent episodes of dizziness.  His dizziness he describes as feeling slightly off with feeling off balance.  1 episode where he felt like he was staring off into space and was not responding as normal for approximately 1 minute and then that resolved.  No history of seizures.  Denies any significant headache.  Has been evaluated by his primary care physician and cardiology for this episodes.  Today was driving to work and was in his normal state of health.  While driving to work felt a wave of anxiety and shortness of breath with numbness and tingling sensation in his hands and the back of his neck.  Denies any history of anxiety.  Denies any chest discomfort today.  Denies nausea vomiting or diaphoresis.  Endorses compliance with his home medications.  Denies any falls or head trauma.  Endorses intermittent episodes of some blurred vision but denies double vision.  No dysuria, fever, chills, weight loss or weight gain.  Denies alcohol, drug or nicotine use.  Denies change of urination.  On chart review patient was evaluated by cardiology on 11/13/2023 and had his pacemaker interrogated, the device was functioning properly.  Cardiac MRI 02/10/2023 with normal EF.  No valvular abnormalities.  Evaluated at primary care office on 11/18/2023 for an episode of spacing out, dizziness and occasional tinnitus -screening D-dimer for PE was negative, low risk Wells criteria.  A1c within normal limits.  CT scan of his head was negative.  Instructed  to stop Cozaar  and start HCTZ.  Referral to neurology if dizziness did not resolve.  Referral for physical therapy for dizziness.     Physical Exam   Triage Vital Signs: ED Triage Vitals  Encounter Vitals Group     BP 11/28/23 0703 (!) 147/74     Girls Systolic BP Percentile --      Girls Diastolic BP Percentile --      Boys Systolic BP Percentile --      Boys Diastolic BP Percentile --      Pulse Rate 11/28/23 0703 100     Resp --      Temp 11/28/23 0703 97.7 F (36.5 C)     Temp Source 11/28/23 0703 Oral     SpO2 11/28/23 0703 99 %     Weight 11/28/23 0700 200 lb (90.7 kg)     Height 11/28/23 0700 5' 7 (1.702 m)     Head Circumference --      Peak Flow --      Pain Score 11/28/23 0700 3     Pain Loc --      Pain Education --      Exclude from Growth Chart --     Most recent vital signs: Vitals:   11/28/23 0940 11/28/23 0945  BP:  119/66  Pulse: 78 74  Resp: (!) 24 20  Temp:    SpO2: 100% 99%    Physical Exam Constitutional:      Appearance: He is well-developed.  HENT:  Head: Atraumatic.     Mouth/Throat:     Mouth: Mucous membranes are moist.  Eyes:     Extraocular Movements: Extraocular movements intact.     Conjunctiva/sclera: Conjunctivae normal.     Pupils: Pupils are equal, round, and reactive to light.  Cardiovascular:     Rate and Rhythm: Regular rhythm.     Pulses: Normal pulses.     Heart sounds: No murmur heard. Pulmonary:     Effort: No respiratory distress.  Abdominal:     Tenderness: There is no abdominal tenderness.  Musculoskeletal:        General: Normal range of motion.     Cervical back: Normal range of motion and neck supple.     Right lower leg: No edema.     Left lower leg: No edema.  Skin:    General: Skin is warm.  Neurological:     Mental Status: He is alert. Mental status is at baseline.     GCS: GCS eye subscore is 4. GCS verbal subscore is 5. GCS motor subscore is 6.     Cranial Nerves: Cranial nerves 2-12 are  intact.     Sensory: Sensation is intact.     Motor: Motor function is intact.     Coordination: Coordination is intact.     Gait: Gait is intact. Tandem walk normal.     IMPRESSION / MDM / ASSESSMENT AND PLAN / ED COURSE  I reviewed the triage vital signs and the nursing notes.  Differential diagnosis including dissection, central vertigo including CVA, peripheral vertigo, Mnire's disease, electrolyte abnormality given recent change of antihypertensive medications, anemia, orthostatic hypotension, anxiety, ACS  Lower suspicion for pacemaker abnormality given recent interrogation with normal functioning device  EKG  I, Clotilda Punter, the attending physician, personally viewed and interpreted this ECG.  EKG with atrial sensed ventricularly paced rhythm.  Worsening ST depression to inferior leads.  No significant ST elevation.  ST depression to the lateral leads.  Repeat EKG obtained with ST depression to the inferior leads.  No significant ST elevation.  No significant change when compared to initial EKG.  No tachycardic or bradycardic dysrhythmias while on cardiac telemetry.  RADIOLOGY I independently reviewed imaging, my interpretation of imaging: Chest x-ray with no widened mediastinum and no signs of pneumonia.  Pacemaker leads in place.  Read as left-sided pacemaker with leads in the right atrial appendage and right ventricle.  No acute findings.  LABS (all labs ordered are listed, but only abnormal results are displayed) Labs interpreted as -    Labs Reviewed  COMPREHENSIVE METABOLIC PANEL WITH GFR - Abnormal; Notable for the following components:      Result Value   Potassium 3.1 (*)    Glucose, Bld 109 (*)    All other components within normal limits  CBC WITH DIFFERENTIAL/PLATELET - Abnormal; Notable for the following components:   MCHC 36.8 (*)    All other components within normal limits  MAGNESIUM  TSH  TROPONIN I (HIGH SENSITIVITY)     MDM  Plan for lab  work, orthostatic blood pressures.  Will obtain a CTA head and neck to further evaluate for central caus of dizziness.  Will discuss with MRI if he could get an MRI to evaluate for central cause of CVA with his pacemaker.  Risks of blood pressures were positive.  Given IV fluids.  No significant leukocytosis ptosis or anemia.  Creatinine at baseline.  Troponin is negative and no chest pain at this time,  do not feel that serial troponins are necessary at this time.  No exertional symptoms and have a low suspicion for ACS.  TSH within normal limits.  CTA head and neck without findings of dissection or vessel occlusion.  No signs of intracranial hemorrhage.  No signs of an aneurysm.  Reevaluation continues to have a normal neurologic exam and is able to ambulate in the emergency department without any difficulties.  Clinical picture is most consistent with peripheral vertigo, possibly some component of anxiety.  Discussed close follow-up with primary care physician, given a referral and information to follow-up with ENT.  Will do prescription of meclizine  as needed and discussed Epley maneuvers.  Discussed incidental finding of cervical stenosis and outpatient workup.  Discussed MRI to further evaluate for central causes of vertigo however patient declined and did not want an MRI at this time.  I do not feel that that is necessary no obvious CVA on his CT scan of his head and symptoms have been ongoing for more than 1 month.  Discussed need to follow-up as an outpatient and possible outpatient MRI.  Discussed return precautions.  No questions at time of discharge.     PROCEDURES:  Critical Care performed: No  Procedures  Patient's presentation is most consistent with acute presentation with potential threat to life or bodily function.   MEDICATIONS ORDERED IN ED: Medications  lactated ringers bolus 1,000 mL (1,000 mLs Intravenous New Bag/Given 11/28/23 0818)  potassium chloride (KLOR-CON) packet  40 mEq (40 mEq Oral Given 11/28/23 0909)  iohexol  (OMNIPAQUE ) 350 MG/ML injection 75 mL (75 mLs Intravenous Contrast Given 11/28/23 0925)    FINAL CLINICAL IMPRESSION(S) / ED DIAGNOSES   Final diagnoses:  Dizziness  Cervical stenosis of spinal canal     Rx / DC Orders   ED Discharge Orders          Ordered    meclizine  (ANTIVERT ) 25 MG tablet  3 times daily PRN        11/28/23 1035             Note:  This document was prepared using Dragon voice recognition software and may include unintentional dictation errors.   Suzanne Kirsch, MD 11/28/23 1039

## 2023-11-28 NOTE — ED Notes (Signed)
 Patient transported to CT

## 2023-11-28 NOTE — Discharge Instructions (Addendum)
 You were seen in the emergency department following an episode of dizziness.  You had a CT scan of your head and neck that did not show any abnormalities to explain your symptoms today.  You did have findings of cervical stenosis, follow this up with your primary care physician.  Your thyroid  studies, your heart enzyme and your lab work was overall normal.  Your blood pressure did drop whenever you went to stand.  It is importantly stay hydrated and drink plenty of fluids.  Call to follow-up closely with your primary care physician.  You are given information to follow-up with the ear nose and throat specialist for vertigo.  You are given a prescription for a dizzy medication called meclizine  that you can take as needed for severe room spinning dizziness.  This medication can make you tired so do not drive on it.  Return to the emergency department if you have any return of symptoms.  Your potassium was mildly low when checked today.  You are given oral potassium while in the emergency department.  Make sure to follow up with a primary doctor to follow up your labs.  Make sure to eat food high in potassium and magnesium - examples - potatoes, spinach, bananas, beans, avocadoes, oranges, nuts.

## 2023-11-28 NOTE — ED Notes (Signed)
 Pt denies dizziness during orthostatic vital signs.

## 2023-11-28 NOTE — ED Triage Notes (Signed)
 Patient ambulatory to triage with steady gait, without difficulty or distress noted; pt reports dizziness for several wks, worse this morning; now with tingling to hands and neck; some mild mid CP, nonradiating accomp by [facility]; pt has pacemaker

## 2023-11-28 NOTE — ED Notes (Signed)
 ED Provider at bedside.

## 2023-12-04 ENCOUNTER — Other Ambulatory Visit: Payer: Self-pay

## 2023-12-12 ENCOUNTER — Other Ambulatory Visit: Payer: Self-pay

## 2023-12-19 ENCOUNTER — Ambulatory Visit: Payer: Self-pay | Admitting: Cardiology

## 2023-12-19 ENCOUNTER — Ambulatory Visit (INDEPENDENT_AMBULATORY_CARE_PROVIDER_SITE_OTHER): Payer: Self-pay

## 2023-12-19 DIAGNOSIS — I442 Atrioventricular block, complete: Secondary | ICD-10-CM

## 2023-12-19 LAB — CUP PACEART REMOTE DEVICE CHECK
Battery Remaining Longevity: 104 mo
Battery Remaining Percentage: 94 %
Battery Voltage: 2.99 V
Brady Statistic AP VP Percent: 36 %
Brady Statistic AP VS Percent: 1 %
Brady Statistic AS VP Percent: 64 %
Brady Statistic AS VS Percent: 1 %
Brady Statistic RA Percent Paced: 36 %
Brady Statistic RV Percent Paced: 99 %
Date Time Interrogation Session: 20250822020014
Implantable Lead Connection Status: 753985
Implantable Lead Connection Status: 753985
Implantable Lead Implant Date: 20241015
Implantable Lead Implant Date: 20241015
Implantable Lead Location: 753859
Implantable Lead Location: 753860
Implantable Pulse Generator Implant Date: 20241015
Lead Channel Impedance Value: 400 Ohm
Lead Channel Impedance Value: 490 Ohm
Lead Channel Pacing Threshold Amplitude: 0.5 V
Lead Channel Pacing Threshold Amplitude: 0.875 V
Lead Channel Pacing Threshold Pulse Width: 0.5 ms
Lead Channel Pacing Threshold Pulse Width: 0.5 ms
Lead Channel Sensing Intrinsic Amplitude: 5 mV
Lead Channel Sensing Intrinsic Amplitude: 5.9 mV
Lead Channel Setting Pacing Amplitude: 1.125
Lead Channel Setting Pacing Amplitude: 2 V
Lead Channel Setting Pacing Pulse Width: 0.5 ms
Lead Channel Setting Sensing Sensitivity: 3 mV
Pulse Gen Model: 2272
Pulse Gen Serial Number: 8219145

## 2023-12-27 ENCOUNTER — Other Ambulatory Visit: Payer: Self-pay

## 2023-12-27 ENCOUNTER — Other Ambulatory Visit: Payer: Self-pay | Admitting: Internal Medicine

## 2023-12-27 DIAGNOSIS — I10 Essential (primary) hypertension: Secondary | ICD-10-CM

## 2023-12-28 ENCOUNTER — Other Ambulatory Visit: Payer: Self-pay

## 2023-12-29 MED ORDER — AMLODIPINE BESYLATE 10 MG PO TABS
10.0000 mg | ORAL_TABLET | Freq: Every day | ORAL | 0 refills | Status: DC
  Filled 2023-12-29: qty 90, 90d supply, fill #0

## 2023-12-29 NOTE — Telephone Encounter (Signed)
 Requested Prescriptions  Pending Prescriptions Disp Refills   amLODipine  (NORVASC ) 10 MG tablet 90 tablet 0    Sig: Take 1 tablet (10 mg total) by mouth daily.     Cardiovascular: Calcium Channel Blockers 2 Passed - 12/29/2023 10:42 AM      Passed - Last BP in normal range    BP Readings from Last 1 Encounters:  11/28/23 119/66         Passed - Last Heart Rate in normal range    Pulse Readings from Last 1 Encounters:  11/28/23 89         Passed - Valid encounter within last 6 months    Recent Outpatient Visits           1 month ago Essential hypertension   [facility] [place]. [facility] Barnie NOVAK, MD   1 month ago Loss of consciousness [facility])   [facility] Of [place]. [facility] Belvie BRAVO, MD   2 months ago Essential hypertension   [facility] Of [place]. [facility] Barnie NOVAK, MD   6 months ago Essential hypertension    [facility] [place]. [facility] Barnie NOVAK, MD   10 months ago Establishing care with new doctor, encounter for   [facility] [place] - A Dept Of [place]. [facility] Barnie NOVAK, MD

## 2023-12-30 ENCOUNTER — Other Ambulatory Visit: Payer: Self-pay

## 2023-12-31 ENCOUNTER — Other Ambulatory Visit: Payer: Self-pay

## 2024-01-15 NOTE — Progress Notes (Signed)
 Remote PPM Transmission

## 2024-02-11 ENCOUNTER — Other Ambulatory Visit: Payer: Self-pay

## 2024-02-18 ENCOUNTER — Other Ambulatory Visit: Payer: Self-pay

## 2024-02-24 ENCOUNTER — Ambulatory Visit: Payer: Self-pay | Attending: Internal Medicine | Admitting: Internal Medicine

## 2024-02-24 ENCOUNTER — Other Ambulatory Visit: Payer: Self-pay

## 2024-02-24 ENCOUNTER — Encounter: Payer: Self-pay | Admitting: Internal Medicine

## 2024-02-24 VITALS — BP 146/87 | HR 75 | Temp 98.4°F | Ht 67.0 in | Wt 202.0 lb

## 2024-02-24 DIAGNOSIS — I10 Essential (primary) hypertension: Secondary | ICD-10-CM

## 2024-02-24 DIAGNOSIS — K219 Gastro-esophageal reflux disease without esophagitis: Secondary | ICD-10-CM

## 2024-02-24 DIAGNOSIS — F411 Generalized anxiety disorder: Secondary | ICD-10-CM

## 2024-02-24 MED ORDER — SERTRALINE HCL 50 MG PO TABS
ORAL_TABLET | ORAL | 1 refills | Status: DC
  Filled 2024-02-24: qty 60, 70d supply, fill #0
  Filled 2024-05-03: qty 60, 60d supply, fill #1

## 2024-02-24 MED ORDER — VALSARTAN 40 MG PO TABS
40.0000 mg | ORAL_TABLET | Freq: Every day | ORAL | 1 refills | Status: AC
  Filled 2024-02-24: qty 90, 90d supply, fill #0
  Filled 2024-05-25: qty 90, 90d supply, fill #1

## 2024-02-24 NOTE — Progress Notes (Signed)
 Patient ID: Jaime Moore, male    DOB: 1970-07-10  MRN: [id number]  CC: Follow-up (ER f/u. [place] anxiety /No to all vax)   Subjective: Jaime Moore is a 53 y.o. male who presents for chronic ds management. Girlfriend [name] is with him. His concerns today include:  Patient with history of HTN, HL, CKD 2, GERD, history of complete heart block/pacemaker present   Discussed the use of AI scribe software for clinical note transcription with the patient, who gave verbal consent to proceed.  History of Present Illness Jaime Moore is a 53 year old male with hypertension who presents for follow-up of blood pressure management and anxiety symptoms.  He has a history of hypertension and was last seen in July 2025. At that time, his medication regimen was adjusted by discontinuing Cozaar  and starting hydrochlorothiazide , while continuing amlodipine . He takes these medications as prescribed. He monitors his blood pressure at home using a wrist device, which he suspects may be inaccurate. Recent readings at home have varied, with a recent measurement of 127/86 mmHg. He limits salt intake in his diet.  He reports experiencing anxiety for the past couple of months, particularly when driving and stopping at stoplights, which causes him to feel anxious and have difficulty breathing. He describes episodes of tingling in his hands and neck, which led to an ER visit in August where a CT scan was performed. The scan revealed degenerative changes in the cervical spine but no occluded blood vessels in the head. He experiences anxiety attacks that include hyperventilation, tingling in fingers, and heart racing, lasting less than a minute. These episodes were occurring daily in the mornings around late August to early September but have since decreased in frequency. No history of anxiety prior to a few months ago. He works as a curator and notes that anxiety episodes do not occur at work, feeling better when active.  He reports that the anxiety episodes have decreased in frequency over the past few weeks. GAD-7 score weaning test positive today.  Depression screen also positive but patient states that he does not feel depressed and does not think this is a major issue at this time.  In August 2025, he was seen in the ER for dizziness, where a scan showed degenerative changes in the cervical spine and spinal stenosis at the C6-7 level. His family has a history of back issues. Blood tests at that time showed low potassium levels, which may be related to hydrochlorothiazide  use.      Patient Active Problem List   Diagnosis Date Noted   Loss of consciousness (HCC) 11/06/2023   New daily persistent headache 11/06/2023   Essential hypertension 02/14/2023   Major depressive disorder, single episode, mild 02/14/2023   Situational anxiety 02/14/2023   Complete heart block (HCC) 02/10/2023   Dizziness 10/06/2018     Current Outpatient Medications on File Prior to Visit  Medication Sig Dispense Refill   amLODipine  (NORVASC ) 10 MG tablet Take 1 tablet (10 mg total) by mouth daily. 90 tablet 0   aspirin  EC 81 MG tablet Take 1 tablet (81 mg total) by mouth daily. Swallow whole. 90 tablet 1   fluticasone  (FLONASE ) 50 MCG/ACT nasal spray Place 1 spray into both nostrils daily. 16 g 6   hydrochlorothiazide  (HYDRODIURIL ) 12.5 MG tablet Take 1 tablet (12.5 mg total) by mouth daily. 90 tablet 3   omeprazole  (PRILOSEC) 20 MG capsule Take 1 capsule (20 mg total) by mouth daily. 90 capsule 1   meclizine  (  ANTIVERT ) 25 MG tablet Take 1 tablet (25 mg total) by mouth 3 (three) times daily as needed for dizziness. (Patient not taking: Reported on 02/24/2024) 30 tablet 0   No current facility-administered medications on file prior to visit.    No Known Allergies  Social History   Socioeconomic History   Marital status: Single    Spouse name: Not on file   Number of children: Not on file   Years of education: Not on  file   Highest education level: Not on file  Occupational History   Occupation: Primary school teacher  Tobacco Use   Smoking status: Never   Smokeless tobacco: Never  Vaping Use   Vaping status: Never Used  Substance and Sexual Activity   Alcohol use: No   Drug use: No   Sexual activity: Not on file  Other Topics Concern   Not on file  Social History Narrative   Not on file   Social Drivers of Health   Financial Resource Strain: Low Risk  (06/20/2023)   Overall Financial Resource Strain (CARDIA)    Difficulty of Paying Living Expenses: Not very hard  Food Insecurity: No Food Insecurity (06/20/2023)   Hunger Vital Sign    Worried About Running Out of Food in the Last Year: Never true    Ran Out of Food in the Last Year: Never true  Transportation Needs: No Transportation Needs (06/20/2023)   PRAPARE - Administrator, Civil Service (Medical): No    Lack of Transportation (Non-Medical): No  Physical Activity: Insufficiently Active (06/20/2023)   Exercise Vital Sign    Days of Exercise per Week: 5 days    Minutes of Exercise per Session: 20 min  Stress: Stress Concern Present (06/20/2023)   [employer] of Occupational Health - Occupational Stress Questionnaire    Feeling of Stress : To some extent  Social Connections: Socially Isolated (06/20/2023)   Social Connection and Isolation Panel    Frequency of Communication with Friends and Family: Three times a week    Frequency of Social Gatherings with Friends and Family: More than three times a week    Attends Religious Services: Never    Database Administrator or Organizations: No    Attends Banker Meetings: Never    Marital Status: Never married  Intimate Partner Violence: Not At Risk (06/20/2023)   Humiliation, Afraid, Rape, and Kick questionnaire    Fear of Current or Ex-Partner: No    Emotionally Abused: No    Physically Abused: No    Sexually Abused: No    Family History  Problem Relation Age of Onset    Cancer Mother        lung CA   Heart attack Brother    Heart disease Brother    Heart disease Maternal Grandmother    Cancer - Other Other        lung CA   Hyperlipidemia Other     Past Surgical History:  Procedure Laterality Date   PACEMAKER IMPLANT N/A 02/11/2023   Procedure: PACEMAKER IMPLANT;  Surgeon: Inocencio Soyla Lunger, MD;  Location: [facility] INVASIVE CV LAB;  Service: Cardiovascular;  Laterality: N/A;    ROS: Review of Systems Negative except as stated above  PHYSICAL EXAM: BP (!) 146/87   Pulse 75   Temp 98.4 F (36.9 C) (Oral)   Ht 5' 7 (1.702 m)   Wt 202 lb (91.6 kg)   SpO2 97%   BMI 31.64 kg/m   Physical Exam  General appearance - alert, well appearing, and in no distress Mental status - normal mood, behavior, speech, dress, motor activity, and thought processes Chest - clear to auscultation, no wheezes, rales or rhonchi, symmetric air entry Heart - normal rate, regular rhythm, normal S1, S2, no murmurs, rubs, clicks or gallops Extremities - peripheral pulses normal, no pedal edema, no clubbing or cyanosis    02/24/2024    4:01 PM 11/18/2023    9:21 AM 11/06/2023   10:44 AM  Depression screen PHQ 2/9  Decreased Interest 0 1 0  Down, Depressed, Hopeless 0 0 1  PHQ - 2 Score 0 1 1  Altered sleeping 3 2 3   Tired, decreased energy 3 2 1   Change in appetite 0 0 0  Feeling bad or failure about yourself  0 0 0  Trouble concentrating 1 0 3  Moving slowly or fidgety/restless 3 2 3   Suicidal thoughts 0 0 0  PHQ-9 Score 10 7 11   Difficult doing work/chores Somewhat difficult Somewhat difficult       02/24/2024    4:01 PM 11/18/2023    9:22 AM 11/06/2023   10:44 AM 10/20/2023    4:58 PM  GAD 7 : Generalized Anxiety Score  Nervous, Anxious, on Edge 3 2 1  0  Control/stop worrying 1 1 1 1   Worry too much - different things 1 1 1 1   Trouble relaxing 3 2 2  0  Restless 3 2 3 2   Easily annoyed or irritable 2 3 3 2   Afraid - awful might happen 1 1 2 1   Total  GAD 7 Score 14 12 13 7   Anxiety Difficulty Somewhat difficult Somewhat difficult  Somewhat difficult        Latest Ref Rng & Units 11/28/2023    7:44 AM 11/06/2023   11:36 AM 02/11/2023    4:07 AM  CMP  Glucose 70 - 99 mg/dL 890  871  97   BUN 6 - 20 mg/dL 16  17  16    Creatinine 0.61 - 1.24 mg/dL 8.95  8.84  8.84   Sodium 135 - 145 mmol/L 138  141  140   Potassium 3.5 - 5.1 mmol/L 3.1  4.5  3.9   Chloride 98 - 111 mmol/L 102  103  104   CO2 22 - 32 mmol/L 25  19  24    Calcium 8.9 - 10.3 mg/dL 9.3  89.7  9.4   Total Protein 6.5 - 8.1 g/dL 7.4  7.6    Total Bilirubin 0.0 - 1.2 mg/dL 1.0  0.4    Alkaline Phos 38 - 126 U/L 68  85    AST 15 - 41 U/L 25  19    ALT 0 - 44 U/L 20  20     Lipid Panel     Component Value Date/Time   CHOL 203 (H) 10/07/2018 0653   TRIG 85 10/07/2018 0653   HDL 38 (L) 10/07/2018 0653   CHOLHDL 5.3 10/07/2018 0653   VLDL 17 10/07/2018 0653   LDLCALC 148 (H) 10/07/2018 0653    CBC    Component Value Date/Time   WBC 8.5 11/28/2023 0744   RBC 4.47 11/28/2023 0744   HGB 14.6 11/28/2023 0744   HGB 14.0 11/06/2023 1136   HCT 39.7 11/28/2023 0744   HCT 44.2 11/06/2023 1136   PLT 321 11/28/2023 0744   PLT 333 11/06/2023 1136   MCV 88.8 11/28/2023 0744   MCV 97 11/06/2023 1136   MCH 32.7 11/28/2023 0744  MCHC 36.8 (H) 11/28/2023 0744   RDW 11.9 11/28/2023 0744   RDW 12.7 11/06/2023 1136   LYMPHSABS 2.0 11/28/2023 0744   LYMPHSABS 1.7 11/06/2023 1136   MONOABS 0.7 11/28/2023 0744   EOSABS 0.1 11/28/2023 0744   EOSABS 0.0 11/06/2023 1136   BASOSABS 0.0 11/28/2023 0744   BASOSABS 0.1 11/06/2023 1136    ASSESSMENT AND PLAN: 1. Essential hypertension (Primary) At goal.  Continue Norvasc  10 mg daily and HCTZ 12.5 mg daily.  We will add valsartan 40 mg daily.  Check BMP for potassium level.  Continue to monitor blood pressure at home.  Advised to bring home blood pressure monitoring device on next visit. - Basic Metabolic Panel - valsartan  (DIOVAN) 40 MG tablet; Take 1 tablet (40 mg total) by mouth daily.  Dispense: 90 tablet; Refill: 1  2. Generalized anxiety disorder Patient having anxiety with some panic attacks.  We discussed starting him on low-dose of Zoloft at 25 mg daily for 3 weeks and then after that 50 mg daily.  Advised patient that if he develops any worsening anxiety or depression he should stop the medicine and give us  a call.  I will see him back in about 2 months to see how he is doing. - sertraline (ZOLOFT) 50 MG tablet; Take 0.5 tablets (25 mg total) by mouth daily for 21 days, THEN 1 tablet (50 mg total) daily.  Dispense: 60 tablet; Refill: 1   Patient was given the opportunity to ask questions.  Patient verbalized understanding of the plan and was able to repeat key elements of the plan.   This documentation was completed using Paediatric nurse.  Any transcriptional errors are unintentional.  Orders Placed This Encounter  Procedures   Basic Metabolic Panel     Requested Prescriptions   Signed Prescriptions Disp Refills   valsartan (DIOVAN) 40 MG tablet 90 tablet 1    Sig: Take 1 tablet (40 mg total) by mouth daily.   sertraline (ZOLOFT) 50 MG tablet 60 tablet 1    Sig: Take 0.5 tablets (25 mg total) by mouth daily for 21 days, THEN 1 tablet (50 mg total) daily.    Return in about 8 weeks (around 04/20/2024) for f/u BP and anxiety.  Barnie Louder, MD, FACP

## 2024-02-24 NOTE — Patient Instructions (Signed)
  VISIT SUMMARY: Today, we discussed your blood pressure management and anxiety symptoms. We reviewed your current medications and recent home blood pressure readings. We also addressed your anxiety and panic attacks, and discussed a new medication to help manage these symptoms.  YOUR PLAN: -ESSENTIAL HYPERTENSION: Essential hypertension means high blood pressure without a known cause. Your blood pressure remains high despite your current medications. We will add valsartan to your regimen and continue your current medications, amlodipine  and hydrochlorothiazide . Please check your blood pressure at home regularly and bring your wrist cuff to the next appointment to check its accuracy.  -HYPOKALEMIA: Hypokalemia means low potassium levels in your blood, likely due to the hydrochlorothiazide . We will order a repeat potassium level today to monitor this.  -GENERALIZED ANXIETY DISORDER WITH PANIC ATTACKS: Generalized anxiety disorder with panic attacks means you experience excessive anxiety and sudden episodes of intense fear. We will start you on Zoloft 25 mg daily, and you can increase to 50 mg after 3-4 weeks if tolerated. Please monitor for any worsening anxiety or depression while on this medication.  INSTRUCTIONS: Please follow up with a repeat potassium level test today. Continue to monitor your blood pressure at home and bring your wrist cuff to the next appointment. Start taking Zoloft 25 mg daily and increase to 50 mg after 3-4 weeks if tolerated. Monitor for any worsening anxiety or depression and report any concerns.                      Contains text generated by Abridge.                                 Contains text generated by Abridge.

## 2024-02-25 ENCOUNTER — Ambulatory Visit: Payer: Self-pay | Admitting: Internal Medicine

## 2024-02-25 LAB — BASIC METABOLIC PANEL WITH GFR
BUN/Creatinine Ratio: 13 (ref 9–20)
BUN: 12 mg/dL (ref 6–24)
CO2: 25 mmol/L (ref 20–29)
Calcium: 10.2 mg/dL (ref 8.7–10.2)
Chloride: 99 mmol/L (ref 96–106)
Creatinine, Ser: 0.96 mg/dL (ref 0.76–1.27)
Glucose: 85 mg/dL (ref 70–99)
Potassium: 3.8 mmol/L (ref 3.5–5.2)
Sodium: 139 mmol/L (ref 134–144)
eGFR: 95 mL/min/1.73 (ref >59)

## 2024-03-04 ENCOUNTER — Encounter: Payer: Self-pay | Admitting: Internal Medicine

## 2024-03-05 ENCOUNTER — Ambulatory Visit: Payer: Self-pay

## 2024-03-05 NOTE — Telephone Encounter (Signed)
 FYI Only or Action Required?: Action required by provider: clinical question for provider and update on patient condition.  Patient was last seen in primary care on 02/24/2024 by Vicci Barnie NOVAK, MD.  Called Nurse Triage reporting No chief complaint on file..  Symptoms began a week ago.  Interventions attempted: Nothing.  Symptoms are: gradually worsening.  Triage Disposition: Call PCP When Office is Open  Patient/caregiver understands and will follow disposition?: Yes  Copied from CRM [id number]. Topic: Clinical - Red Word Triage >> Mar 05, 2024 12:23 PM Hadassah PARAS wrote: Red Word that prompted transfer to Nurse Triage: Pt was given Anxiety med and it has gotten worse. Pt is unsure of what to do. Possibly stop taking med and have new one prescribed Reason for Disposition  Caller wants to use a complementary or alternative medicine  Answer Assessment - Initial Assessment Questions 1. NAME of MEDICINE: What medicine(s) are you calling about?      Zoloft 25mg   2. QUESTION: What is your question? (e.g., double dose of medicine, side effect)     Anxiety seems to be worse, do I need to stop taking it or can I get something different?  3. PRESCRIBER: Who prescribed the medicine? Reason: if prescribed by specialist, call should be referred to that group.     Dr. Vicci  4. SYMPTOMS: Do you have any symptoms? If Yes, ask: What symptoms are you having?  How bad are the symptoms (e.g., mild, moderate, severe)     Anxiety getting worse  Protocols used: Medication Question Call-A-AH

## 2024-03-06 ENCOUNTER — Other Ambulatory Visit (HOSPITAL_COMMUNITY): Payer: Self-pay

## 2024-03-06 ENCOUNTER — Other Ambulatory Visit: Payer: Self-pay | Admitting: Internal Medicine

## 2024-03-06 MED ORDER — BUSPIRONE HCL 5 MG PO TABS
5.0000 mg | ORAL_TABLET | Freq: Two times a day (BID) | ORAL | 1 refills | Status: DC
  Filled 2024-03-06 (×2): qty 60, 30d supply, fill #0
  Filled 2024-04-04 – 2024-04-05 (×2): qty 60, 30d supply, fill #1

## 2024-03-08 NOTE — Telephone Encounter (Signed)
 Pt sent Mychart message.

## 2024-03-19 ENCOUNTER — Ambulatory Visit: Payer: Self-pay

## 2024-03-19 DIAGNOSIS — I442 Atrioventricular block, complete: Secondary | ICD-10-CM

## 2024-03-19 LAB — CUP PACEART REMOTE DEVICE CHECK
Battery Remaining Longevity: 104 mo
Battery Remaining Percentage: 91 %
Battery Voltage: 2.99 V
Brady Statistic AP VP Percent: 35 %
Brady Statistic AP VS Percent: 1 %
Brady Statistic AS VP Percent: 64 %
Brady Statistic AS VS Percent: 1 %
Brady Statistic RA Percent Paced: 35 %
Brady Statistic RV Percent Paced: 99 %
Date Time Interrogation Session: 20251121035847
Implantable Lead Connection Status: 753985
Implantable Lead Connection Status: 753985
Implantable Lead Implant Date: 20241015
Implantable Lead Implant Date: 20241015
Implantable Lead Location: 753859
Implantable Lead Location: 753860
Implantable Pulse Generator Implant Date: 20241015
Lead Channel Impedance Value: 440 Ohm
Lead Channel Impedance Value: 550 Ohm
Lead Channel Pacing Threshold Amplitude: 0.625 V
Lead Channel Pacing Threshold Amplitude: 0.75 V
Lead Channel Pacing Threshold Pulse Width: 0.5 ms
Lead Channel Pacing Threshold Pulse Width: 0.5 ms
Lead Channel Sensing Intrinsic Amplitude: 4.7 mV
Lead Channel Sensing Intrinsic Amplitude: 7.6 mV
Lead Channel Setting Pacing Amplitude: 1 V
Lead Channel Setting Pacing Amplitude: 2 V
Lead Channel Setting Pacing Pulse Width: 0.5 ms
Lead Channel Setting Sensing Sensitivity: 3 mV
Pulse Gen Model: 2272
Pulse Gen Serial Number: 8219145

## 2024-03-21 ENCOUNTER — Ambulatory Visit: Payer: Self-pay | Admitting: Cardiology

## 2024-03-22 NOTE — Progress Notes (Signed)
 Remote PPM Transmission

## 2024-03-23 ENCOUNTER — Other Ambulatory Visit: Payer: Self-pay | Admitting: Internal Medicine

## 2024-03-23 DIAGNOSIS — I10 Essential (primary) hypertension: Secondary | ICD-10-CM

## 2024-03-23 DIAGNOSIS — K219 Gastro-esophageal reflux disease without esophagitis: Secondary | ICD-10-CM

## 2024-03-23 MED ORDER — AMLODIPINE BESYLATE 10 MG PO TABS
10.0000 mg | ORAL_TABLET | Freq: Every day | ORAL | 0 refills | Status: DC
  Filled 2024-03-23 – 2024-03-25 (×2): qty 90, 90d supply, fill #0

## 2024-03-23 MED ORDER — OMEPRAZOLE 20 MG PO CPDR
20.0000 mg | DELAYED_RELEASE_CAPSULE | Freq: Every day | ORAL | 0 refills | Status: AC
  Filled 2024-03-23 – 2024-03-25 (×2): qty 90, 90d supply, fill #0

## 2024-03-24 ENCOUNTER — Other Ambulatory Visit: Payer: Self-pay

## 2024-03-26 ENCOUNTER — Other Ambulatory Visit: Payer: Self-pay

## 2024-03-31 ENCOUNTER — Other Ambulatory Visit: Payer: Self-pay

## 2024-04-05 ENCOUNTER — Other Ambulatory Visit: Payer: Self-pay

## 2024-05-03 ENCOUNTER — Other Ambulatory Visit: Payer: Self-pay | Admitting: Internal Medicine

## 2024-05-03 ENCOUNTER — Other Ambulatory Visit: Payer: Self-pay

## 2024-05-04 ENCOUNTER — Ambulatory Visit: Payer: Self-pay | Attending: Internal Medicine | Admitting: Internal Medicine

## 2024-05-04 ENCOUNTER — Other Ambulatory Visit: Payer: Self-pay

## 2024-05-04 VITALS — BP 125/71 | HR 83 | Temp 98.4°F | Ht 67.0 in | Wt 204.0 lb

## 2024-05-04 DIAGNOSIS — F411 Generalized anxiety disorder: Secondary | ICD-10-CM

## 2024-05-04 DIAGNOSIS — I10 Essential (primary) hypertension: Secondary | ICD-10-CM

## 2024-05-04 MED ORDER — BUSPIRONE HCL 5 MG PO TABS
5.0000 mg | ORAL_TABLET | Freq: Two times a day (BID) | ORAL | 2 refills | Status: AC
  Filled 2024-05-04: qty 180, 90d supply, fill #0

## 2024-05-04 MED ORDER — AMLODIPINE BESYLATE 10 MG PO TABS
10.0000 mg | ORAL_TABLET | Freq: Every day | ORAL | 1 refills | Status: AC
  Filled 2024-05-04: qty 90, 90d supply, fill #0

## 2024-05-04 NOTE — Progress Notes (Signed)
 "   Patient ID: Jaime Moore, male    DOB: 1971-02-25  MRN: [id number]  CC: Hypertension (HTN f/u. Med refill. /No questions / concerns/No to flu vax)   Subjective: Jaime Moore is a 54 y.o. male who presents for chronic ds management. Girlfriend [name] is with him.  His chronic medical issues include:  Patient with history of HTN, HL, CKD 2, GERD, history of complete heart block/pacemaker present   Discussed the use of AI scribe software for clinical note transcription with the patient, who gave verbal consent to proceed.  History of Present Illness Jaime Moore is a 54 year old male with hypertension and anxiety who presents for a follow-up visit.  His anxiety has improved since switching from sertraline  to Buspar . He is currently taking Buspar  5 mg twice daily and finds it effective. He requests a refill as he is running low. GAD-7 score has decreased. Does not feel he needs increase dose.  Regarding his hypertension, he continues to take valsartan  40 mg daily, hydrochlorothiazide  25 mg daily, and amlodipine  10 mg daily. His blood pressure readings at home are consistently around 125/77 mmHg. No chest pain, shortness of breath, leg swelling, or dizziness. He continues to work as a curator, noting that work has been slow around the holiday season.   He has not received any COVID-19 vaccinations and declines them.     Patient Active Problem List   Diagnosis Date Noted   Loss of consciousness (HCC) 11/06/2023   New daily persistent headache 11/06/2023   Essential hypertension 02/14/2023   Major depressive disorder, single episode, mild 02/14/2023   Situational anxiety 02/14/2023   Complete heart block (HCC) 02/10/2023   Dizziness 10/06/2018     Medications Ordered Prior to Encounter[1]  Allergies[2]  Social History   Socioeconomic History   Marital status: Single    Spouse name: Not on file   Number of children: Not on file   Years of education: Not on file   Highest  education level: Not on file  Occupational History   Occupation: Primary school teacher  Tobacco Use   Smoking status: Never   Smokeless tobacco: Never  Vaping Use   Vaping status: Never Used  Substance and Sexual Activity   Alcohol use: No   Drug use: No   Sexual activity: Not on file  Other Topics Concern   Not on file  Social History Narrative   Not on file   Social Drivers of Health   Tobacco Use: Low Risk (02/24/2024)   Patient History    Smoking Tobacco Use: Never    Smokeless Tobacco Use: Never    Passive Exposure: Not on file  Financial Resource Strain: Low Risk (06/20/2023)   Overall Financial Resource Strain (CARDIA)    Difficulty of Paying Living Expenses: Not very hard  Food Insecurity: No Food Insecurity (06/20/2023)   Hunger Vital Sign    Worried About Running Out of Food in the Last Year: Never true    Ran Out of Food in the Last Year: Never true  Transportation Needs: No Transportation Needs (06/20/2023)   PRAPARE - Administrator, Civil Service (Medical): No    Lack of Transportation (Non-Medical): No  Physical Activity: Insufficiently Active (06/20/2023)   Exercise Vital Sign    Days of Exercise per Week: 5 days    Minutes of Exercise per Session: 20 min  Stress: Stress Concern Present (06/20/2023)   [employer] of Occupational Health - Occupational Stress Questionnaire    Feeling  of Stress : To some extent  Social Connections: Socially Isolated (06/20/2023)   Social Connection and Isolation Panel    Frequency of Communication with Friends and Family: Three times a week    Frequency of Social Gatherings with Friends and Family: More than three times a week    Attends Religious Services: Never    Database Administrator or Organizations: No    Attends Banker Meetings: Never    Marital Status: Never married  Intimate Partner Violence: Not At Risk (06/20/2023)   Humiliation, Afraid, Rape, and Kick questionnaire    Fear of Current or  Ex-Partner: No    Emotionally Abused: No    Physically Abused: No    Sexually Abused: No  Depression (PHQ2-9): Medium Risk (05/04/2024)   Depression (PHQ2-9)    PHQ-2 Score: 5  Alcohol Screen: Low Risk (06/20/2023)   Alcohol Screen    Last Alcohol Screening Score (AUDIT): 0  Housing: Low Risk (06/20/2023)   Housing Stability Vital Sign    Unable to Pay for Housing in the Last Year: No    Number of Times Moved in the Last Year: 0    Homeless in the Last Year: No  Utilities: Not At Risk (06/20/2023)   [facility] Utilities    Threatened with loss of utilities: No  Health Literacy: Adequate Health Literacy (02/14/2023)   B1300 Health Literacy    Frequency of need for help with medical instructions: Rarely    Family History  Problem Relation Age of Onset   Cancer Mother        lung CA   Heart attack Brother    Heart disease Brother    Heart disease Maternal Grandmother    Cancer - Other Other        lung CA   Hyperlipidemia Other     Past Surgical History:  Procedure Laterality Date   PACEMAKER IMPLANT N/A 02/11/2023   Procedure: PACEMAKER IMPLANT;  Surgeon: Inocencio Soyla Lunger, MD;  Location: [facility];  Service: Cardiovascular;  Laterality: N/A;    ROS: Review of Systems Negative except as stated above  PHYSICAL EXAM: BP 125/71 (BP Location: Left Arm, Patient Position: Sitting, Cuff Size: Normal)   Pulse 83   Temp 98.4 F (36.9 C) (Oral)   Ht 5' 7 (1.702 m)   Wt 204 lb (92.5 kg)   SpO2 96%   BMI 31.95 kg/m   Physical Exam   General appearance - alert, well appearing, middle age to older caucasian male and in no distress Mental status - normal mood, behavior, speech, dress, motor activity, and thought processes Neck - supple, no significant adenopathy Chest - clear to auscultation, no wheezes, rales or rhonchi, symmetric air entry Heart - normal rate, regular rhythm, normal S1, S2, no murmurs, rubs, clicks or gallops Ext: no LE edema     Latest Ref Rng &  Units 02/24/2024    4:51 PM 11/28/2023    7:44 AM 11/06/2023   11:36 AM  CMP  Glucose 70 - 99 mg/dL 85  890  871   BUN 6 - 24 mg/dL 12  16  17    Creatinine 0.76 - 1.27 mg/dL 9.03  8.95  8.84   Sodium 134 - 144 mmol/L 139  138  141   Potassium 3.5 - 5.2 mmol/L 3.8  3.1  4.5   Chloride 96 - 106 mmol/L 99  102  103   CO2 20 - 29 mmol/L 25  25  19  Calcium 8.7 - 10.2 mg/dL 89.7  9.3  89.7   Total Protein 6.5 - 8.1 g/dL  7.4  7.6   Total Bilirubin 0.0 - 1.2 mg/dL  1.0  0.4   Alkaline Phos 38 - 126 U/L  68  85   AST 15 - 41 U/L  25  19   ALT 0 - 44 U/L  20  20    Lipid Panel     Component Value Date/Time   CHOL 203 (H) 10/07/2018 0653   TRIG 85 10/07/2018 0653   HDL 38 (L) 10/07/2018 0653   CHOLHDL 5.3 10/07/2018 0653   VLDL 17 10/07/2018 0653   LDLCALC 148 (H) 10/07/2018 0653    CBC    Component Value Date/Time   WBC 8.5 11/28/2023 0744   RBC 4.47 11/28/2023 0744   HGB 14.6 11/28/2023 0744   HGB 14.0 11/06/2023 1136   HCT 39.7 11/28/2023 0744   HCT 44.2 11/06/2023 1136   PLT 321 11/28/2023 0744   PLT 333 11/06/2023 1136   MCV 88.8 11/28/2023 0744   MCV 97 11/06/2023 1136   MCH 32.7 11/28/2023 0744   MCHC 36.8 (H) 11/28/2023 0744   RDW 11.9 11/28/2023 0744   RDW 12.7 11/06/2023 1136   LYMPHSABS 2.0 11/28/2023 0744   LYMPHSABS 1.7 11/06/2023 1136   MONOABS 0.7 11/28/2023 0744   EOSABS 0.1 11/28/2023 0744   EOSABS 0.0 11/06/2023 1136   BASOSABS 0.0 11/28/2023 0744   BASOSABS 0.1 11/06/2023 1136      05/04/2024    4:10 PM 02/24/2024    4:01 PM 11/18/2023    9:22 AM 11/06/2023   10:44 AM  GAD 7 : Generalized Anxiety Score  Nervous, Anxious, on Edge 1 3 2 1   Control/stop worrying 1 1 1 1   Worry too much - different things 1 1 1 1   Trouble relaxing 1 3 2 2   Restless 1 3 2 3   Easily annoyed or irritable 1 2 3 3   Afraid - awful might happen 0 1 1 2   Total GAD 7 Score 6 14 12 13   Anxiety Difficulty Somewhat difficult Somewhat difficult Somewhat difficult         05/04/2024    4:10 PM 02/24/2024    4:01 PM 11/18/2023    9:21 AM  Depression screen PHQ 2/9  Decreased Interest 0 0 1  Down, Depressed, Hopeless 0 0 0  PHQ - 2 Score 0 0 1  Altered sleeping 2 3 2   Tired, decreased energy 1 3 2   Change in appetite 0 0 0  Feeling bad or failure about yourself  0 0 0  Trouble concentrating 1 1 0  Moving slowly or fidgety/restless 1 3 2   Suicidal thoughts 0 0 0  PHQ-9 Score 5 10  7    Difficult doing work/chores Somewhat difficult Somewhat difficult Somewhat difficult     Data saved with a previous flowsheet row definition    ASSESSMENT AND PLAN: 1. Essential hypertension Controlled. Continue Norvasc  10 mg daily, hydrochlorothiazide  12.5 mg and Diovan  40 mg daily - amLODipine  (NORVASC ) 10 MG tablet; Take 1 tablet (10 mg total) by mouth daily.  Dispense: 90 tablet; Refill: 1  2. Generalized anxiety disorder (Primary) Doing well on Buspar  with decrease in GAD-7 score as noted above. Screening done today in office. Continue Buspar  5 mg BID - busPIRone  (BUSPAR ) 5 MG tablet; Take 1 tablet (5 mg total) by mouth 2 (two) times daily.  Dispense: 180 tablet; Refill: 2  There are no diagnoses linked to this encounter.   Patient was given the opportunity to ask questions.  Patient verbalized understanding of the plan and was able to repeat key elements of the plan.   This documentation was completed using Paediatric nurse.  Any transcriptional errors are unintentional.  No orders of the defined types were placed in this encounter.    Requested Prescriptions   Signed Prescriptions Disp Refills   busPIRone  (BUSPAR ) 5 MG tablet 180 tablet 2    Sig: Take 1 tablet (5 mg total) by mouth 2 (two) times daily.   amLODipine  (NORVASC ) 10 MG tablet 90 tablet 1    Sig: Take 1 tablet (10 mg total) by mouth daily.    Return in about 5 months (around 10/02/2024).  Barnie Louder, MD, FACP     [1]  Current Outpatient Medications on  File Prior to Visit  Medication Sig Dispense Refill   aspirin  EC 81 MG tablet Take 1 tablet (81 mg total) by mouth daily. Swallow whole. 90 tablet 1   fluticasone  (FLONASE ) 50 MCG/ACT nasal spray Place 1 spray into both nostrils daily. 16 g 6   hydrochlorothiazide  (HYDRODIURIL ) 12.5 MG tablet Take 1 tablet (12.5 mg total) by mouth daily. 90 tablet 3   meclizine  (ANTIVERT ) 25 MG tablet Take 1 tablet (25 mg total) by mouth 3 (three) times daily as needed for dizziness. 30 tablet 0   omeprazole  (PRILOSEC) 20 MG capsule Take 1 capsule (20 mg total) by mouth daily. 90 capsule 0   valsartan  (DIOVAN ) 40 MG tablet Take 1 tablet (40 mg total) by mouth daily. 90 tablet 1   No current facility-administered medications on file prior to visit.  [2] No Known Allergies  "

## 2024-05-04 NOTE — Patient Instructions (Signed)
" °  VISIT SUMMARY: Today, you came in for a follow-up visit to check on your anxiety and hypertension. Your anxiety has improved since switching to Buspar , and your blood pressure readings are consistently within the target range. We discussed your current medications and provided refills where needed.  YOUR PLAN: -GENERALIZED ANXIETY DISORDER: Generalized anxiety disorder is a condition characterized by excessive worry about various aspects of life. Your anxiety has improved with Buspar , and you should continue taking 5 mg twice daily. A three-month supply of Buspar  has been provided.  -ESSENTIAL HYPERTENSION: Essential hypertension is high blood pressure with no identifiable cause. Your blood pressure is well-controlled with your current medications. Continue taking valsartan  40 mg daily, hydrochlorothiazide  25 mg daily, and amlodipine  10 mg daily. Refills for all medications have been ensured.  INSTRUCTIONS: Please continue taking your medications as prescribed. If you experience any new symptoms or have concerns, schedule a follow-up appointment. No additional tests or consultations are needed at this time.                      Contains text generated by Abridge.                                 Contains text generated by Abridge.                          Contains text generated by Abridge.                                 Contains text generated by Abridge.   "

## 2024-05-05 ENCOUNTER — Other Ambulatory Visit: Payer: Self-pay

## 2024-05-17 ENCOUNTER — Other Ambulatory Visit: Payer: Self-pay

## 2024-05-25 ENCOUNTER — Other Ambulatory Visit: Payer: Self-pay

## 2024-05-26 ENCOUNTER — Other Ambulatory Visit: Payer: Self-pay

## 2024-06-29 ENCOUNTER — Ambulatory Visit: Payer: Self-pay | Admitting: Cardiology

## 2024-10-12 ENCOUNTER — Ambulatory Visit: Payer: Self-pay | Admitting: Internal Medicine
